# Patient Record
Sex: Female | Born: 1945 | Hispanic: Yes | Marital: Single | Smoking: Never smoker
Health system: Southern US, Community
[De-identification: ages and names within clinical notes are randomized; demographics above are authoritative.]

## PROBLEM LIST (undated history)

## (undated) DIAGNOSIS — I10 Essential (primary) hypertension: Secondary | ICD-10-CM

---

## 2016-05-24 ENCOUNTER — Emergency Department
Admission: EM | Admit: 2016-05-24 | Discharge: 2016-05-28 | Disposition: A | Discharge status: Other Acute Inpatient Hospital | Payer: Medicaid Other | Attending: Emergency Medicine | Admitting: Emergency Medicine

## 2016-05-24 ENCOUNTER — Encounter: Payer: Self-pay | Admitting: Emergency Medicine

## 2016-05-24 DIAGNOSIS — I10 Essential (primary) hypertension: Secondary | ICD-10-CM | POA: Insufficient documentation

## 2016-05-24 DIAGNOSIS — F23 Brief psychotic disorder: Secondary | ICD-10-CM | POA: Insufficient documentation

## 2016-05-24 DIAGNOSIS — R259 Unspecified abnormal involuntary movements: Secondary | ICD-10-CM | POA: Diagnosis not present

## 2016-05-24 DIAGNOSIS — R451 Restlessness and agitation: Secondary | ICD-10-CM | POA: Diagnosis present

## 2016-05-24 DIAGNOSIS — R4182 Altered mental status, unspecified: Secondary | ICD-10-CM

## 2016-05-24 DIAGNOSIS — Z5181 Encounter for therapeutic drug level monitoring: Secondary | ICD-10-CM | POA: Diagnosis not present

## 2016-05-24 HISTORY — DX: Essential (primary) hypertension: I10

## 2016-05-24 LAB — COMPREHENSIVE METABOLIC PANEL
ALT: 17 U/L (ref 14–54)
ANION GAP: 10 (ref 5–15)
AST: 18 U/L (ref 15–41)
Albumin: 4.3 g/dL (ref 3.5–5.0)
Alkaline Phosphatase: 93 U/L (ref 38–126)
BUN: 16 mg/dL (ref 6–20)
CALCIUM: 9.2 mg/dL (ref 8.9–10.3)
CHLORIDE: 102 mmol/L (ref 101–111)
CO2: 28 mmol/L (ref 22–32)
Creatinine, Ser: 0.69 mg/dL (ref 0.44–1.00)
GFR calc non Af Amer: >60 mL/min (ref >60)
Glucose, Bld: 112 mg/dL — ABNORMAL HIGH (ref 65–99)
POTASSIUM: 3.4 mmol/L — AB (ref 3.5–5.1)
SODIUM: 140 mmol/L (ref 135–145)
Total Bilirubin: 0.6 mg/dL (ref 0.3–1.2)
Total Protein: 8.2 g/dL — ABNORMAL HIGH (ref 6.5–8.1)

## 2016-05-24 LAB — CBC WITH DIFFERENTIAL/PLATELET
Basophils Absolute: 0.1 10*3/uL (ref 0–0.1)
Basophils Relative: 1 %
EOS ABS: 0.1 10*3/uL (ref 0–0.7)
Eosinophils Relative: 1 %
HCT: 40.2 % (ref 35.0–47.0)
HEMOGLOBIN: 13.7 g/dL (ref 12.0–16.0)
LYMPHS ABS: 2.6 10*3/uL (ref 1.0–3.6)
LYMPHS PCT: 22 %
MCH: 28.8 pg (ref 26.0–34.0)
MCHC: 34.1 g/dL (ref 32.0–36.0)
MCV: 84.3 fL (ref 80.0–100.0)
Monocytes Absolute: 0.6 10*3/uL (ref 0.2–0.9)
Monocytes Relative: 5 %
NEUTROS PCT: 71 %
Neutro Abs: 8.7 10*3/uL — ABNORMAL HIGH (ref 1.4–6.5)
Platelets: 276 10*3/uL (ref 150–440)
RBC: 4.76 MIL/uL (ref 3.80–5.20)
RDW: 13.8 % (ref 11.5–14.5)
WBC: 12 10*3/uL — AB (ref 3.6–11.0)

## 2016-05-24 LAB — ETHANOL: Alcohol, Ethyl (B): <5 mg/dL (ref <5)

## 2016-05-24 NOTE — ED Notes (Signed)
Pt. Has son-in-law who is the next door neighbor contact [phone]
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
## 2016-05-25 LAB — URINALYSIS COMPLETE WITH MICROSCOPIC (ARMC ONLY)
BILIRUBIN URINE: NEGATIVE
Bacteria, UA: NONE SEEN
GLUCOSE, UA: NEGATIVE mg/dL
Ketones, ur: NEGATIVE mg/dL
LEUKOCYTES UA: NEGATIVE
NITRITE: NEGATIVE
PH: 6 (ref 5.0–8.0)
Protein, ur: NEGATIVE mg/dL
SPECIFIC GRAVITY, URINE: 1.012 (ref 1.005–1.030)

## 2016-05-25 LAB — URINE DRUG SCREEN, QUALITATIVE (ARMC ONLY)
AMPHETAMINES, UR SCREEN: NOT DETECTED
BENZODIAZEPINE, UR SCRN: NOT DETECTED
Barbiturates, Ur Screen: NOT DETECTED
COCAINE METABOLITE, UR ~~LOC~~: NOT DETECTED
Cannabinoid 50 Ng, Ur ~~LOC~~: NOT DETECTED
MDMA (Ecstasy)Ur Screen: NOT DETECTED
METHADONE SCREEN, URINE: NOT DETECTED
OPIATE, UR SCREEN: NOT DETECTED
Phencyclidine (PCP) Ur S: NOT DETECTED
TRICYCLIC, UR SCREEN: NOT DETECTED

## 2016-05-25 MED ORDER — QUETIAPINE FUMARATE 25 MG PO TABS
100.0000 mg | ORAL_TABLET | Freq: Every day | ORAL | Status: DC
  Administered 2016-05-25 – 2016-05-28 (×4): 100 mg via ORAL
  Filled 2016-05-25 (×4): qty 4
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
## 2016-05-25 NOTE — ED Provider Notes (Signed)
-----------------------------------------   2:26 PM on 05/25/2016 -----------------------------------------  I discussed the case with the telepsychiatrist on-call who has evaluated the patient, recommends inpatient admission for mania in the setting of likely bipolar disorder. He recommends 100 mg of Seroquel daily at bedtime which I have ordered as well as prn  Ativan for anxiety.   Gayla DossEryka A Akia Desroches, MD 05/25/16 [phone]

## 2016-05-25 NOTE — ED Notes (Signed)
PT IVC PENDING PSYCH CONSULT. 

## 2016-05-25 NOTE — Progress Notes (Addendum)
Referral information for  Placement have been faxed to;     [facility] ([phone]),    [facility] [phone]/[phone]),    [facility] [phone]),    [facility] ([phone]),    [facility]: Pt denied       05/25/2016 Cheryl FlashNicole Lan Mcneill, MS, NCC, LPCA Therapeutic Triage Specialist
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
## 2016-05-25 NOTE — Progress Notes (Signed)
LCSW completed request for interpreter at 11am through porthole. LCSW will meet with patient and collect information to complete assessment and FL2 and discuss options with patient and family.   Delta Air LinesClaudine Hyland Mollenkopf LCSW [phone]
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
## 2016-05-25 NOTE — Progress Notes (Signed)
[facility]  and [facility].   Rebekah Senatelaudine Mychael Soots LCSW [phone]

## 2016-05-25 NOTE — BH Assessment (Signed)
Assessment Note  Rebekah [name] is an 70 y.o. female presenting to the ED under IVC, initiated by patient's family.  Historical information limited due to patient's AMS and patient's limited English speaking skills.    According to to the IVC, patient is  not eating or drinking at home. Patient denies.  Pt. family reports pt. Gets very argumentative and repeats herself.  Adult protective services is involved.    Diagnosis: Altered Mental Status  Past Medical History:  Past Medical History:  Diagnosis Date  . Hypertension     No past surgical history on file.  Family History: No family history on file.  Social History:  reports that she has never smoked. She does not have any smokeless tobacco history on file. She reports that she does not drink alcohol or use drugs.  Additional Social History:  Alcohol / Drug Use History of alcohol / drug use?: No history of alcohol / drug abuse  CIWA: CIWA-Ar BP: (!) 194/92 Pulse Rate: 65 COWS:    Allergies: No Known Allergies  Home Medications:  (Not in a hospital admission)  OB/GYN Status:  No LMP recorded.  General Assessment Data Location of Assessment: [facility] ED TTS Assessment: In system Is this a Tele or Face-to-Face Assessment?: Face-to-Face Is this an Initial Assessment or a Re-assessment for this encounter?: Initial Assessment Marital status: Single Maiden name: n/a Is patient pregnant?: No Pregnancy Status: No Living Arrangements: Children Can pt return to current living arrangement?: Yes Admission Status: Involuntary Is patient capable of signing voluntary admission?: No Referral Source: Self/Family/Friend Insurance type: None  Medical Screening Exam [facility] Walk-in ONLY) Medical Exam completed: Yes  Crisis Care Plan Living Arrangements: Children Legal Guardian: Other: (Self) Name of Psychiatrist: unknown Name of Therapist: unknown  Education Status Is patient currently in school?: No Current Grade:  n/a Highest grade of school patient has completed: n/a Name of school: n/a Contact person: n/a  Risk to self with the past 6 months Suicidal Ideation: No Has patient been a risk to self within the past 6 months prior to admission? : No Suicidal Intent: No Has patient had any suicidal intent within the past 6 months prior to admission? : No Is patient at risk for suicide?: No Suicidal Plan?: No Has patient had any suicidal plan within the past 6 months prior to admission? : No Access to Means: No What has been your use of drugs/alcohol within the last 12 months?: none reported Previous Attempts/Gestures: No Other Self Harm Risks: Pt's family reports that she is not eating. Triggers for Past Attempts: None known Intentional Self Injurious Behavior: None Family Suicide History: Unknown Recent stressful life event(s): Other (Comment) Persecutory voices/beliefs?: No Depression: No Substance abuse history and/or treatment for substance abuse?: No Suicide prevention information given to non-admitted patients: Not applicable  Risk to Others within the past 6 months Homicidal Ideation: No Does patient have any lifetime risk of violence toward others beyond the six months prior to admission? : No Thoughts of Harm to Others: No Current Homicidal Intent: No Current Homicidal Plan: No Access to Homicidal Means: No Identified Victim: None identified History of harm to others?: No Assessment of Violence: None Noted Violent Behavior Description: None identified Does patient have access to weapons?: No Criminal Charges Pending?: No Does patient have a court date: No Is patient on probation?: No  Psychosis Hallucinations: None noted Delusions: None noted  Mental Status Report Appearance/Hygiene: In scrubs Eye Contact: Good Motor Activity: Freedom of movement Speech: Language other than  English Level of Consciousness: Alert Mood: Apprehensive Affect: Appropriate to  circumstance Anxiety Level: Minimal Thought Processes: Unable to Assess Judgement: Unable to Assess Orientation: Unable to assess Obsessive Compulsive Thoughts/Behaviors: Unable to Assess  Cognitive Functioning Concentration: Unable to Assess Memory: Unable to Assess Insight: Unable to Assess Impulse Control: Unable to Assess Appetite: Fair Sleep: Unable to Assess Vegetative Symptoms: None  ADLScreening [facility]) Patient's cognitive ability adequate to safely complete daily activities?: Yes Patient able to express need for assistance with ADLs?: Yes Independently performs ADLs?: Yes (appropriate for developmental age)  Prior Inpatient Therapy Prior Inpatient Therapy: No Prior Therapy Dates: n/a Prior Therapy Facilty/Provider(s): n/a Reason for Treatment: n/a  Prior Outpatient Therapy Prior Outpatient Therapy: No Prior Therapy Dates: n/a Prior Therapy Facilty/Provider(s): n/a Reason for Treatment: n/a Does patient have an ACCT team?: No Does patient have Intensive In-House Services?  : No Does patient have [facility] services? : No Does patient have P4CC services?: No  ADL Screening (condition at time of admission) Patient's cognitive ability adequate to safely complete daily activities?: Yes Patient able to express need for assistance with ADLs?: Yes Independently performs ADLs?: Yes (appropriate for developmental age)       Abuse/Neglect Assessment (Assessment to be complete while patient is alone) Physical Abuse: Denies Verbal Abuse: Denies Sexual Abuse: Denies Exploitation of patient/patient's resources: Denies Self-Neglect: Denies Values / Beliefs Cultural Requests During Hospitalization: None Spiritual Requests During Hospitalization: None Consults Spiritual Care Consult Needed: No Social Work Consult Needed: Yes (Comment) Advance Directives (For Healthcare) Does patient have an advance directive?: No    Additional Information 1:1 In  Past 12 Months?: No CIRT Risk: No Elopement Risk: No Does patient have medical clearance?: Yes     Disposition:  Disposition Initial Assessment Completed for this Encounter: Yes Disposition of Patient: Other dispositions Other disposition(s): Other (Comment) (Pending Psych MD consult)  On Site Evaluation by:   Reviewed with Physician:    Magdalene Tardiff C Lupie Sawa 05/25/2016 5:11 AM

## 2016-05-25 NOTE — Progress Notes (Signed)
L[facility] and requested additional IVC and clinical notes. They were faxed and we are awaiting nurse to review and possibly accept patient. We are at capacity in the [facility] @[facility]  LCSW awaiting a call back. Delta Air LinesClaudine Chrisann Melaragno  [phone]

## 2016-05-25 NOTE — Progress Notes (Signed)
TTS and LCSW consulted and [facility] say [facility] had [facility] female beds [facility]- psych, Pt information was faxed over and admission nurse will review and call back.    Delta Air LinesClaudine Rakisha Pincock LCSW [phone]

## 2016-05-25 NOTE — Progress Notes (Signed)
LCSW called and left a message for son in law to call me back to assist with some information on patient. Awaiting call back.   Delta Air LinesClaudine Dezmond Downie LCSW [phone]
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
## 2016-05-26 NOTE — ED Notes (Signed)
Pt assessed with spanish interpreter.  Pt. Alert and oriented, warm and dry, in no distress. Pt. Denies SI, HI, and AVH. Patient continues to talk about the [name] and blessing and praying for everyone. Pt. Encouraged to let nursing staff know of any concerns or needs.

## 2016-05-26 NOTE — ED Notes (Signed)
Patient noted in room. No complaints, stable, in no acute distress. Q15 minute rounds and monitoring via Security Cameras to continue.  

## 2016-05-26 NOTE — ED Notes (Signed)
PT IVC/SOC COMPLETE/PT PENDING PLACEMENT/REFERRELS FAXED.

## 2016-05-26 NOTE — ED Notes (Signed)
Patient is in dayroom, no signs of distress, Patient smiling, will continue to monitor, camera monitoring in progress.

## 2016-05-26 NOTE — ED Notes (Signed)
Patient is in room, appears to be meditating, or praying, patient is cooperative and pleasant.

## 2016-05-26 NOTE — ED Notes (Addendum)
Patient sitting in dayroom.  She is asking to go to her previous room in the main ED.

## 2016-05-26 NOTE — ED Notes (Signed)
Patient is spanish speaking and became upset because wanted her bible.  Interpreter services called to translate to the patient that she could not have her bible on the unit because it is hardback.  Chaplain was called, however they do not have any spanish Catholic bibles.  First interpreter was Rebekah [name] [phone] however called disconnected.  Second [facility] [phone]).

## 2016-05-26 NOTE — ED Notes (Signed)
Patient talking on phone to granddaughter. 

## 2016-05-26 NOTE — Progress Notes (Signed)
Pt being reviewed for possible admission to [facility]. H&P and Assessment have been faxed to the [facility] for the charge nurse to review and provide bed assignment.      Nicole Gwynn Crossley, MS, NCC, LPCA Therapeutic Triage Specialist    

## 2016-05-26 NOTE — ED Notes (Addendum)
Son in law called back able to give correct pass word.

## 2016-05-26 NOTE — ED Notes (Signed)
Patient resting sitting up, eyes closed, appears to be praying, q 15 min. Checks and camera surveillance in progress.

## 2016-05-26 NOTE — ED Notes (Signed)
Patient is awake, she is cooperative, no signs of distress, patient with q 15 min. Checks and camera monitoring in progress.

## 2016-05-26 NOTE — ED Notes (Signed)
Patient's son in law called wanting info on PT. He was unable to give pass code. Advised no info could be given without. Rebekah [name] states would call back.

## 2016-05-26 NOTE — ED Notes (Signed)
Patient in dayroom refusing to go to her assigned room.

## 2016-05-26 NOTE — ED Notes (Signed)
Patient came into dayroom, asking for something, made motion of book, nurse obtained her Bible and gave it to her, and patient became extremely happy, patient said " Gracious ' several times. Patient started reading out-loud in dayroom, patient was redirected back to her room due to another Patient in dayroom trying to watch tv. Patient is safe,q 15 minutes checks and camera monitoring in progress.

## 2016-05-26 NOTE — ED Notes (Signed)
Patient taking shower.
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
## 2016-05-26 NOTE — ED Notes (Signed)
Rebekah CoolsDavid [name] [phone] patient son in law

## 2016-05-27 ENCOUNTER — Emergency Department: Payer: Medicaid Other

## 2016-05-27 DIAGNOSIS — F3112 Bipolar disorder, current episode manic without psychotic features, moderate: Secondary | ICD-10-CM

## 2016-05-27 NOTE — ED Notes (Signed)
This Clinical research associatewriter and interpreter went in with patient to do assessment. Pt. Alert and oriented, warm and dry, in no distress. Pt. Denies SI, HI, and AVH. Pt. Encouraged to let nursing staff know of any concerns or needs.
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
## 2016-05-27 NOTE — ED Notes (Signed)

## 2016-05-27 NOTE — ED Notes (Signed)

## 2016-05-27 NOTE — ED Provider Notes (Signed)
-----------------------------------------   7:02 AM on 05/27/2016 -----------------------------------------   Blood pressure 118/71, pulse 68, temperature 97.5 F (36.4 C), temperature source Oral, resp. rate 18, height 5' (1.524 m), weight 120 lb (54.4 kg), SpO2 100 %.  The patient had no acute events since last update.  Calm and cooperative at this time.  Disposition is pending Psychiatry/Behavioral Medicine team recommendations.     Sharman CheekPhillip Emilina Smarr, MD 05/27/16 [phone]

## 2016-05-27 NOTE — Progress Notes (Signed)
Per Dr. Toni Amendlapacs [facility] pt. [facility], no medical problems. Rebekah [name], LCAS-A, LPC-A, [facility]  Counselor 05/27/2016 4:10 PM

## 2016-05-27 NOTE — ED Notes (Addendum)
Rebekah [name] [phone]), pt's granddaughter, called (had code) and said that the pt's daughter would like the son-in-law to NOT be the primary family contact. Pt's daughter, Rebekah [name], would like to be the primary contact through an interpreter (or her granddaughter -- Rebekah GoldOlga Common does not speak English -- if an interpreter is not available).   Rebekah [name] says that Rebekah [name] does not like the son-in-law. Rebekah [name] says the son-in-law wants the pt to go to a care facility and get her "out of the way."

## 2016-05-27 NOTE — ED Notes (Signed)
Pt has been calm and cooperative this shift. She has been up to the bathroom, present in the dayroom, and ambulating without difficulty. She smiles and is pleasant. Her family has called multiple times to inquire about her condition and disposition/placement. Family members have had the code, so this Clinical research associatewriter and other staff have given them updates.

## 2016-05-27 NOTE — Consult Note (Signed)
[facility] Face-to-Face Psychiatry Consult   Reason for Consult:  Consult for this 70 year old woman brought in on commitment by Adult Protective Services because of bizarre behavior. Referring Physician:  Inocencio Homes Patient Identification: Rebekah [name] MRN:  [id number] Principal Diagnosis: Moderate bipolar I disorder with mania as current episode [facility]) Diagnosis:   Patient Active Problem List   Diagnosis Date Noted  . Moderate bipolar I disorder with mania as current episode (HCC) [F31.12] 05/27/2016    Total Time spent with patient: 1 hour  Subjective:   Rebekah [name] is a 70 y.o. female patient admitted with "this is all because of my daughter".  HPI:  Patient interviewed with the assistance of a hospital Spanish language interpreter. Secondhand information from the patient's daughter who called the emergency room. Review of labs and vitals and early assessments. 70 year old woman was brought in by Adult Management consultant. They have been called because of concern about the patient's health and well-being. Reports are that the patient has not been eating or drinking for some time although clearly it hasn't been a terribly long time and she doesn't appear to be in particularly ill health. Irving Burton had reported that she has strange behavior. The commitment paperwork from Adult Protective Services describes the patient standing out in the middle of the street, jumping on top of a car and acting bizarre in several ways. On interview today with Spanish language interpreter the patient had a very disorganized and hyperreligious thought process. She was unable to answer most questions directly but instead would go off on tangents telling stories about unrelated people she has known before, things that it happened sometime in the past in the [place], complaints about money etc. Most of it ended up being hyper religious [place] more so as the conversation went along. Patient denies that  she drinks or uses any drugs. Denies having any medical problems that she is aware of. The patient was evasive about how much she sleeps but claims that she sleeps fine. She adamantly declined to have any symptoms.  Social history: Patient has been living here in [place] for about 4 years. Closest relative here locally as her daughter who apparently also only speaks [place]. Reportedly the patient lives nearby to the daughter although I never could clarify the living situation.  Medical history: Patient denies having any kind of medical problems whatsoever. The closest she could come to a was saying that she had an injury to her ankle many years ago.  Substance abuse history: Denies any use of alcohol or drugs currently or in the past.  Past Psychiatric History: Patient denies ever having seen a mental health provider or psychiatrist in the past. I'm not sure if I believe 100% of this because her history is so disorganized. Daughter also says that she is not aware of any mental health history but it sounds like there are some big gaps in what she knows about her mother.  Risk to Self: Suicidal Ideation: No Suicidal Intent: No Is patient at risk for suicide?: No Suicidal Plan?: No Access to Means: No What has been your use of drugs/alcohol within the last 12 months?: none reported Other Self Harm Risks: Pt's family reports that she is not eating. Triggers for Past Attempts: None known Intentional Self Injurious Behavior: None Risk to Others: Homicidal Ideation: No Thoughts of Harm to Others: No Current Homicidal Intent: No Current Homicidal Plan: No Access to Homicidal Means: No Identified Victim: None identified History of harm to others?: No  Assessment of Violence: None Noted Violent Behavior Description: None identified Does patient have access to weapons?: No Criminal Charges Pending?: No Does patient have a court date: No Prior Inpatient Therapy: Prior Inpatient Therapy:  No Prior Therapy Dates: n/a Prior Therapy Facilty/Provider(s): n/a Reason for Treatment: n/a Prior Outpatient Therapy: Prior Outpatient Therapy: No Prior Therapy Dates: n/a Prior Therapy Facilty/Provider(s): n/a Reason for Treatment: n/a Does patient have an ACCT team?: No Does patient have Intensive In-House Services?  : No Does patient have [facility] services? : No Does patient have P4CC services?: No  Past Medical History:  Past Medical History:  Diagnosis Date  . Hypertension    No past surgical history on file. Family History: No family history on file. Family Psychiatric  History: Patient denies any family history of mental illness Social History:  History  Alcohol Use No     History  Drug Use No    Social History   Social History  . Marital status: Single    Spouse name: N/A  . Number of children: N/A  . Years of education: N/A   Social History Main Topics  . Smoking status: Never Smoker  . Smokeless tobacco: None  . Alcohol use No  . Drug use: No  . Sexual activity: Not Asked   Other Topics Concern  . None   Social History Narrative  . None   Additional Social History:    Allergies:  No Known Allergies  Labs: No results found for this or any previous visit (from the past 48 hour(s)).  Current Facility-Administered Medications  Medication Dose Route Frequency Provider Last Rate Last Dose  . QUEtiapine (SEROQUEL) tablet 100 mg  100 mg Oral QHS Gayla Doss, MD   100 mg at 05/26/16 2133   No current outpatient prescriptions on file.    Musculoskeletal: Strength & Muscle Tone: within normal limits Gait & Station: normal Patient leans: N/A  Psychiatric Specialty Exam: Physical Exam  Nursing note and vitals reviewed. Constitutional: She appears well-developed and well-nourished.  HENT:  Head: Normocephalic and atraumatic.  Eyes: Conjunctivae are normal. Pupils are equal, round, and reactive to light.  Neck: Normal range of motion.   Cardiovascular: Normal heart sounds.   Respiratory: Effort normal.  GI: Soft.  Musculoskeletal: Normal range of motion.  Neurological: She is alert.  Skin: Skin is warm and dry.  Psychiatric: Her affect is labile and inappropriate. Her speech is rapid and/or pressured and tangential. Thought content is paranoid. She expresses impulsivity. She expresses no homicidal and no suicidal ideation. She exhibits abnormal recent memory. She is inattentive.    Review of Systems  Constitutional: Negative.   HENT: Negative.   Eyes: Negative.   Respiratory: Negative.   Cardiovascular: Negative.   Gastrointestinal: Negative.   Musculoskeletal: Negative.   Skin: Negative.   Neurological: Negative.   Psychiatric/Behavioral: Negative for depression, hallucinations, memory loss, substance abuse and suicidal ideas. The patient is not nervous/anxious and does not have insomnia.     Blood pressure 118/71, pulse 68, temperature 97.5 F (36.4 C), temperature source Oral, resp. rate 18, height 5' (1.524 m), weight 54.4 kg (120 lb), SpO2 100 %.Body mass index is 23.44 kg/m.  General Appearance: Fairly Groomed  Eye Contact:  Fair  Speech:  Pressured  Volume:  Increased  Mood:  Euphoric  Affect:  Labile  Thought Process:  Disorganized and Irrelevant  Orientation:  Full (Time, Place, and Person)  Thought Content:  Illogical, Paranoid Ideation and Tangential  Suicidal Thoughts:  No  Homicidal Thoughts:  No  Memory:  Immediate;   Good Recent;   Fair Remote;   Fair  Judgement:  Impaired  Insight:  Lacking  Psychomotor Activity:  Increased  Concentration:  Concentration: Fair  Recall:  FiservFair  Fund of Knowledge:  Fair  Language:  Fair  Akathisia:  No  Handed:  Right  AIMS (if indicated):     Assets:  Manufacturing systems engineerCommunication Skills Housing Physical Health Resilience Social Support  ADL's:  Intact  Cognition:  WNL  Sleep:        Treatment Plan Summary: Daily contact with patient to assess and evaluate  symptoms and progress in treatment, Medication management and Plan This is a 70 year old woman who has had bizarre behavior some of which sounds like it has been chronic although it sounds like things of gotten worse recently. To my interview she strikes me as being very likely to be manic. It was hard to get her to cooperate with most of the mental status exam because it was hard to get her slowed down from her hyper religious talk but she did not appear to be demented as near as I can tell. I am putting in a diagnosis of bipolar mania which is tentative of course. Patient had been started on Seroquel by the tele-psychiatry doctor. Patient otherwise appears to be in good health and to be fully ambulatory. I don't see any reason for her to be referred to an outside hospital when we have the ability to take care of her here and even have a psychiatrist who is fluent in [place]. Patient will be admitted to the psychiatry ward for further evaluation and treatment.  Disposition: Recommend psychiatric Inpatient admission when medically cleared. Supportive therapy provided about ongoing stressors.  Mordecai RasmussenJohn Criag Wicklund, MD 05/27/2016 4:07 PM

## 2016-05-27 NOTE — Progress Notes (Signed)
05/27/16: [facility] Allayah Raineri K. Sherlon HandingHarris, LCAS-A, LPC-A, [facility]  Counselor 05/27/2016 3:35 PM

## 2016-05-28 ENCOUNTER — Inpatient Hospital Stay
Admission: RE | Admit: 2016-05-28 | Discharge: 2016-06-03 | DRG: 885 | Disposition: A | Discharge status: Home / Self Care | Payer: No Typology Code available for payment source | Source: Intra-hospital | Attending: Psychiatry | Admitting: Psychiatry

## 2016-05-28 DIAGNOSIS — F319 Bipolar disorder, unspecified: Secondary | ICD-10-CM | POA: Diagnosis present

## 2016-05-28 DIAGNOSIS — F23 Brief psychotic disorder: Secondary | ICD-10-CM | POA: Diagnosis present

## 2016-05-28 DIAGNOSIS — I10 Essential (primary) hypertension: Secondary | ICD-10-CM | POA: Diagnosis present

## 2016-05-28 DIAGNOSIS — F3110 Bipolar disorder, current episode manic without psychotic features, unspecified: Secondary | ICD-10-CM | POA: Diagnosis not present

## 2016-05-28 DIAGNOSIS — G4700 Insomnia, unspecified: Secondary | ICD-10-CM | POA: Diagnosis present

## 2016-05-28 MED ORDER — QUETIAPINE FUMARATE 100 MG PO TABS: 100.0000 mg | ORAL_TABLET | Freq: Every day | ORAL | Status: DC

## 2016-05-28 MED ORDER — ACETAMINOPHEN 325 MG PO TABS: 650.0000 mg | ORAL_TABLET | Freq: Four times a day (QID) | ORAL | Status: DC | PRN

## 2016-05-28 MED ORDER — MAGNESIUM HYDROXIDE 400 MG/5ML PO SUSP: 30.0000 mL | Freq: Every day | ORAL | Status: DC | PRN

## 2016-05-28 MED ORDER — ALUM & MAG HYDROXIDE-SIMETH 200-200-20 MG/5ML PO SUSP: 30.0000 mL | ORAL | Status: DC | PRN

## 2016-05-28 NOTE — ED Notes (Signed)
Patient currently in shower. No signs of acute distress noted. Maintained on 15 minute checks and observation by security camera for safety.

## 2016-05-28 NOTE — ED Notes (Signed)
Patient asleep in room. No noted distress or abnormal behavior. Will continue 15 minute checks and observation by security cameras for safety. 

## 2016-05-28 NOTE — Progress Notes (Signed)
CSW received a call from pt's daughter, Rebekah [name], at [phone]. CSW called interpreter services at [phone] and merged the phone calls so that the interpreter, CSW, and Rebekah [name] were on a conference call together. Rebekah [name] states that she is in fact willing to pick pt up upon d/c. CSW informed her that pt continues to wait on inpt placement at this time, but upon d/c pt will be released into her daughter's care. Rebekah [name] is agreeable with this plan.  Jonathon JordanLynn B Kynlie Jane, MSW, Theresia MajorsLCSWA [phone]

## 2016-05-28 NOTE — ED Provider Notes (Addendum)
-----------------------------------------   7:31 AM on 05/28/2016 -----------------------------------------   Blood pressure 111/76, pulse 74, temperature 97.6 F (36.4 C), temperature source Oral, resp. rate 16, height 5' (1.524 m), weight 120 lb (54.4 kg), SpO2 97 %.  The patient had no acute events since last update.  Calm and cooperative at this time.   The [facility]. [facility] at [facility]   She is awaiting acceptance to a facility.     Rebecka ApleyAllison P Webster, MD 05/28/16 0732    Rebecka ApleyAllison P Webster, MD 05/28/16 [phone]

## 2016-05-28 NOTE — ED Notes (Signed)
Patient resting quietly in room. No noted distress or abnormal behaviors noted. Will continue 15 minute checks and observation by security camera for safety. 

## 2016-05-28 NOTE — BH Assessment (Signed)
[facility]. Toni Amendlapacs.  Attending Physician will be Dr. Ardyth HarpsHernandez.   Patient has been [facility] to room 319, [facility] Charge Nurse [place]ound[place]wen F.   Intake Paper Work has been signed and placed on patient chart.  ER staff is aware of the admission Rebekah [name](Lisa, ER Sect.; Dr. Darnelle CatalanMalinda, ER MD; Kasandra KnudsenKarena, Patient's Nurse & Angelique Blonderenise, Patient Access).

## 2016-05-28 NOTE — Progress Notes (Addendum)
Spoke with Darreld Mcleanrina, RN regarding EKG scheduled for pt. Pt needs an interpreter so now is not a good time. Ask for EKG to be performed in the AM. EKG will be attempted in AM.

## 2016-05-28 NOTE — ED Notes (Signed)
Patient blood pressure is currently 168/97. Physician notified, awaiting further orders at this time.

## 2016-05-28 NOTE — ED Notes (Signed)
ENVIRONMENTAL ASSESSMENT Potentially harmful objects out of patient reach: Yes Personal belongings secured: Yes Patient dressed in hospital provided attire only: Yes Plastic bags out of patient reach: Yes Patient care equipment (cords, cables, call bells, lines, and drains) shortened, removed, or accounted for: Yes Equipment and supplies removed from bottom of stretcher: Yes Potentially toxic materials out of patient reach: Yes Sharps container removed or out of patient reach: Yes  Patient currently in room sleeping. No signs of distress noted. Maintained on 15 minute checks and observation by security camera for safety.  
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
## 2016-05-28 NOTE — Progress Notes (Signed)
CSW called Max Fickleina Reese APS Worker [phone]. CSW informed Inetta Fermoina that pt is still awaiting psych inpt placement. Inetta Fermoina states that to her understanding pt's family is unwilling to take pt home upon d/c. This presents a problem because pt does not have insurance so a group home placement could not be an option. CSW stated that she would check with the family and then call Inetta Fermoina back.  CSW consulted with RN (who speaks fluent Spanish) and she spoke with pt's daughter, [name]. [name] states that she is wiling to have pt come live with her upon d/c.   CSW called an interpreter to come and speak with the family just to confirm the discharge plan and give the family a update on pt's status. CSW called Onalee HuaDavid (son-in-law) [phone] and [name] (pt's daughter) [phone]. Both Onalee Huaavid and Marian Sorrowlga did not answer. CSW left a message. Awaiting call back and will page interpreter if call is returned.   CSW called Max Fickleina Reese [phone] to update her on pt's discharge plan. No answer, left a message. Awaiting call back.  Jonathon JordanLynn B Aailyah Dunbar, MSW, Theresia MajorsLCSWA [phone]

## 2016-05-28 NOTE — ED Notes (Signed)
Patient DSS worker Max Fickleina Reese [phone]) called asking for an update on the patient's status and requested to speak with social work. This Clinical research associatewriter relayed message to social work and informed Inetta Fermoina patient disposition is still undetermined.

## 2016-05-28 NOTE — ED Notes (Signed)

## 2016-05-28 NOTE — ED Notes (Signed)
Patient assessed with interpreter at bedside.  Pt. Alert and oriented, warm and dry, in no distress. Pt. Denies SI, HI, and AVH. Pt was made aware of future plans to be admitted to BMU. Patient in agreement. Pt. Encouraged to let nursing staff know of any concerns or needs.

## 2016-05-28 NOTE — ED Notes (Signed)

## 2016-05-28 NOTE — ED Notes (Signed)
Patient denies SI/HI/AVH and pain. Patient continues to have religious thought content but is cooperative with nursing interventions and calm.  Will continue to monitor. Maintained on 15 minute checks and observation by security camera for safety.

## 2016-05-28 NOTE — ED Notes (Signed)
[facility].

## 2016-05-29 ENCOUNTER — Other Ambulatory Visit: Payer: Self-pay

## 2016-05-29 DIAGNOSIS — F3110 Bipolar disorder, current episode manic without psychotic features, unspecified: Secondary | ICD-10-CM

## 2016-05-29 LAB — LIPID PANEL
CHOL/HDL RATIO: 4.9 ratio
CHOLESTEROL: 172 mg/dL (ref 0–200)
HDL: 35 mg/dL — AB (ref >40)
LDL Cholesterol: UNDETERMINED mg/dL (ref 0–99)
TRIGLYCERIDES: 505 mg/dL — AB (ref <150)
VLDL: UNDETERMINED mg/dL (ref 0–40)

## 2016-05-29 LAB — TSH: TSH: 1.227 u[IU]/mL (ref 0.350–4.500)

## 2016-05-29 MED ORDER — RISPERIDONE 1 MG PO TABS
1.0000 mg | ORAL_TABLET | Freq: Two times a day (BID) | ORAL | Status: DC
  Administered 2016-05-29 – 2016-06-03 (×10): 1 mg via ORAL
  Filled 2016-05-29 (×10): qty 1

## 2016-05-29 MED ORDER — AMLODIPINE BESYLATE 5 MG PO TABS
2.5000 mg | ORAL_TABLET | Freq: Every day | ORAL | Status: DC
  Administered 2016-05-29: 2.5 mg via ORAL
  Filled 2016-05-29: qty 1

## 2016-05-29 MED ORDER — AMLODIPINE BESYLATE 5 MG PO TABS
5.0000 mg | ORAL_TABLET | Freq: Every day | ORAL | Status: DC
  Administered 2016-05-30 – 2016-06-03 (×5): 5 mg via ORAL
  Filled 2016-05-29 (×5): qty 1

## 2016-05-29 MED ORDER — AMLODIPINE BESYLATE 5 MG PO TABS: 5.0000 mg | ORAL_TABLET | Freq: Every day | ORAL | Status: DC

## 2016-05-29 MED ORDER — CLONIDINE HCL 0.1 MG PO TABS
0.1000 mg | ORAL_TABLET | Freq: Once | ORAL | Status: AC
  Administered 2016-05-29: 0.1 mg via ORAL
  Filled 2016-05-29: qty 1

## 2016-05-29 NOTE — [facility] Group Notes (Signed)
[facility] LCSW Group Therapy   05/29/2016  1:00 pm   Type of Therapy: Group Therapy   Participation Level: Invited but did not attend.  Participation Quality: Invited but did not attend.     Hampton AbbotKadijah Shawana Knoch, MSW, LCSWA 05/29/2016, 2:24PM

## 2016-05-29 NOTE — Tx Team (Signed)
Interdisciplinary Treatment and Diagnostic Plan Update  05/29/2016 Time of Session: 11:24 AM  Shia Delaine MRN: [id number]  Principal Diagnosis: Bipolar affective, manic (HCC)  Secondary Diagnoses: Principal Problem:   Bipolar affective, manic (HCC)   Current Medications:  Current Facility-Administered Medications  Medication Dose Route Frequency Provider Last Rate Last Dose  . acetaminophen (TYLENOL) tablet 650 mg  650 mg Oral Q6H PRN Audery Amel, MD      . alum & mag hydroxide-simeth (MAALOX/MYLANTA) 200-200-20 MG/5ML suspension 30 mL  30 mL Oral Q4H PRN Audery Amel, MD      . magnesium hydroxide (MILK OF MAGNESIA) suspension 30 mL  30 mL Oral Daily PRN Audery Amel, MD      . QUEtiapine (SEROQUEL) tablet 100 mg  100 mg Oral QHS Audery Amel, MD        PTA Medications: No prescriptions prior to admission.    Treatment Modalities: Medication Management, Group therapy, Case management,  1 to 1 session with clinician, Psychoeducation, Recreational therapy.   Physician Treatment Plan for Primary Diagnosis: Bipolar affective, manic (HCC) Long Term Goal(s): Improvement in symptoms so as ready for discharge  Short Term Goals: Ability to identify changes in lifestyle to reduce recurrence of condition will improve, Ability to verbalize feelings will improve, Compliance with prescribed medications will improve and Ability to identify triggers associated with substance abuse/mental health issues will improve  Medication Management: Evaluate patient's response, side effects, and tolerance of medication regimen.  Therapeutic Interventions: 1 to 1 sessions, Unit Group sessions and Medication administration.  Evaluation of Outcomes: Progressing  Physician Treatment Plan for Secondary Diagnosis: Principal Problem:   Bipolar affective, manic (HCC)   Long Term Goal(s): Improvement in symptoms so as ready for discharge  Short Term Goals: Ability to identify changes in  lifestyle to reduce recurrence of condition will improve, Ability to verbalize feelings will improve, Ability to demonstrate self-control will improve, Compliance with prescribed medications will improve and Ability to identify triggers associated with substance abuse/mental health issues will improve  Medication Management: Evaluate patient's response, side effects, and tolerance of medication regimen.  Therapeutic Interventions: 1 to 1 sessions, Unit Group sessions and Medication administration.  Evaluation of Outcomes: Progressing   RN Treatment Plan for Primary Diagnosis: Bipolar affective, manic (HCC) Long Term Goal(s): Knowledge of disease and therapeutic regimen to maintain health will improve  Short Term Goals: Ability to remain free from injury will improve, Ability to identify and develop effective coping behaviors will improve and Compliance with prescribed medications will improve  Medication Management: RN will administer medications as ordered by provider, will assess and evaluate patient's response and provide education to patient for prescribed medication. RN will report any adverse and/or side effects to prescribing provider.  Therapeutic Interventions: 1 on 1 counseling sessions, Psychoeducation, Medication administration, Evaluate responses to treatment, Monitor vital signs and CBGs as ordered, Perform/monitor CIWA, COWS, AIMS and Fall Risk screenings as ordered, Perform wound care treatments as ordered.  Evaluation of Outcomes: Progressing   LCSW Treatment Plan for Primary Diagnosis: Bipolar affective, manic (HCC) Long Term Goal(s): Safe transition to appropriate next level of care at discharge, Engage patient in therapeutic group addressing interpersonal concerns.  Short Term Goals: Engage patient in aftercare planning with referrals and resources, Increase social support, Increase ability to appropriately verbalize feelings, Increase emotional regulation, Facilitate  acceptance of mental health diagnosis and concerns and Increase skills for wellness and recovery  Therapeutic Interventions: Assess for all discharge needs, 1 to 1  time with Child psychotherapistocial worker, Explore available resources and support systems, Assess for adequacy in community support network, Educate family and significant other(s) on suicide prevention, Complete Psychosocial Assessment, Interpersonal group therapy.  Evaluation of Outcomes: Progressing   Progress in Treatment: Attending groups: CSW still assessing, pt new to milieu Participating in groups: CSW still assessing, pt new to milieu Taking medication as prescribed: Yes, MD continues to assess for medication changes as needed Toleration medication: Yes, no side effects reported at this time Family/Significant other contact made: CSW, still assessing for appropriate contacts Patient understands diagnosis: Yes Discussing patient identified problems/goals with staff: Yes Medical problems stabilized or resolved:  Yes   Denies suicidal/homicidal ideation: Yes Issues/concerns per patient self-inventory: None Other: N/A  New problem(s) identified: None identified at this time.   New Short Term/Long Term Goal(s): None identified at this time.   Discharge Plan or Barriers: [facility] and will follow up with ___ for medication management and therapy   Reason for Continuation of Hospitalization: Anxiety Depression Hallucinations Delusions  Mania  Estimated Length of Stay/Date of discharge: 3-5 days  Attendees:  Patient:  05/29/2016  10:44 AM  Physician: Dr. Ardyth HarpsHernandez, MD 05/29/2016  10:44 AM  Nursing: Hulan AmatoGwen Farrish, RN 05/29/2016  10:44 AM  Social Worker: Dorothe PeaJonathan F. Durward ParcelRiffey, LCSWA, LCAS 05/29/2016  10:44 AM  Social Worker: Hampton AbbotKadijah Grant LCSWA 05/29/2016  10:44 AM  Recreational Therapist: Hershal CoriaBeth Greene, LRT 05/29/2016  10:44 AM   05/29/2016  10:44 AM   05/29/2016  10:44 AM  Other: 05/29/2016  10:44 AM     Scribe for Treatment Team:  Dorothe PeaJonathan F. Montae Stager MSW, LCSWA, LCAS
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
## 2016-05-29 NOTE — Progress Notes (Signed)
Assessment completed with Hospital Provided Spanish Interpreter, Mr [place]. Ms Chinita Greenlandlbertina Overby was BIB Adult Protective Services on IVC; patient standing in the middle of the street, jumping on top of a car and acting bizarre in several ways. Patient was argumentative with the interpreter, refused to sign "Treatment Agreement & other pertinent forms", denied any odd or bizarre behaviors. She claimed that her daughter threw her out because of constant argument between them. In her possession, patient has a [place] Bible, which she placed on a pillow neatly beside her in the room after the assessment was completed

## 2016-05-29 NOTE — Progress Notes (Signed)
Patient with sad affect, anxious and perseverating over her keys and papers. Patient is worried about her Bible and Bible covers. Patient does have a Bible in her room and is reading this morning. Requests to make long distance phone call to her aunt and gives staff a phone number with 11 digits. Interpretor present for assessment and evaluation and EKG and meeting with social worker this morning. MD into visit patient. No SI/HI/AVH at this time. Patient with unsteady gait and reports she is tired and weak. VS monitored and recorded. Med adjustments in place. Patient rests in bed this afternoon. Good appetite for evening meal. Tolerates po fluids well. Wheelchair and supervision and assist in place for transfers. Patient speaks with Migdalia DkLeidy Toberas at [phone] this evening. Toileted with supervision of 1. Safety maintained.

## 2016-05-29 NOTE — Tx Team (Signed)
Initial Treatment Plan 05/29/2016 2:46 AM Rebekah KeelsAlbertina Myrtis HoppingMarie [name] ZOX:[id number]    PATIENT STRESSORS: Educational concerns Marital or family conflict   PATIENT STRENGTHS: Religious Affiliation   PATIENT IDENTIFIED PROBLEMS: Psychosis  Mood Instability                   DISCHARGE CRITERIA:  Improved stabilization in mood, thinking, and/or behavior Motivation to continue treatment in a less acute level of care Reduction of life-threatening or endangering symptoms to within safe limits  PRELIMINARY DISCHARGE PLAN: Return to previous living arrangement  PATIENT/FAMILY INVOLVEMENT: This treatment plan has been presented to and reviewed with the patient, Rebekah [name].  The patient have been given the opportunity to ask questions and make suggestions.  Cleotis NipperAbiodun T Tresten Pantoja, RN 05/29/2016, 2:46 AM

## 2016-05-29 NOTE — BHH Group Notes (Signed)
BHH Group Notes:  (Nursing/MHT/Case Management/Adjunct)  Date:  05/29/2016  Time:  9:25 PM  Type of Therapy:  Evening Wrap-up Group  Participation Level:  Did Not Attend  Participation Quality:  N/A  Affect:  N/A  Cognitive:  N/A  Insight:  None  Engagement in Group:  N/A  Modes of Intervention:  Activity  Summary of Progress/Problems:  Tomasita MorrowChelsea Nanta Jashawna Reever 05/29/2016, 9:25 PM

## 2016-05-29 NOTE — Tx Team (Signed)
Initial Treatment Plan 05/29/2016 1:12 PM Rebekah Sourslbertina Marie Leib [id number]    PATIENT STRESSORS: Financial difficulties Health problems Marital or family conflict   PATIENT STRENGTHS: Capable of independent living Physical Health   PATIENT IDENTIFIED PROBLEMS: Bipolar    Mania                 DISCHARGE CRITERIA:  Adequate post-discharge living arrangements Improved stabilization in mood, thinking, and/or behavior Motivation to continue treatment in a less acute level of care  PRELIMINARY DISCHARGE PLAN: Attend aftercare/continuing care group Return to previous living arrangement  PATIENT/FAMILY INVOLVEMENT: This treatment plan has been presented to and reviewed with the patient, Rebekah [name], and/or family member, .  The patient and family have been given the opportunity to ask questions and make suggestions.  Ignacia FellingJennifer A Roemello Speyer, RN 05/29/2016, 1:12 PM
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
## 2016-05-29 NOTE — BHH Group Notes (Signed)
B[facility] Group Notes:  (Nursing/MHT/Case Management/Adjunct)  Date:  05/29/2016  Time:  6:02 PM  Type of Therapy:  Psychoeducational Skills  Participation Level:  Did Not Attend   Lynelle SmokeCara Travis [facility] HospitalMadoni 05/29/2016, 6:02 PM

## 2016-05-29 NOTE — Progress Notes (Signed)
Skin search done with Vanice SarahAbi RN and Darreld Mcleanrina RN. No skin abnormalities or contraband found.

## 2016-05-29 NOTE — Progress Notes (Signed)
Called and spoke with Shaune Pollacklubukola, RN about EKG ordered for patient. Pt is not currently having chest pain but Olubukola would still like for the EKG to be performed. Verified that patient is in room. Notified RN that I was on the way to perform EKG. When I arrived the patient had gone to take a shower and was already covered in soap. Shaune PollackOlubukola states that she will call when patient is available for EKG.

## 2016-05-29 NOTE — B[facility] Suicide Risk Assessment (Signed)
[facility]   Nursing information obtained from:    Demographic factors:    Current Mental Status:    Loss Factors:    Historical Factors:    Risk Reduction Factors:     Total Time spent with patient: 1 hour Principal Problem: Bipolar affective, manic (HCC) Diagnosis:   Patient Active Problem List   Diagnosis Date Noted  . Bipolar affective, manic (HCC) [F31.10] 05/28/2016   Subjective Data:   Continued Clinical Symptoms:  Alcohol Use Disorder Identification Test Final Score (AUDIT): 0 The "Alcohol Use Disorders Identification Test", Guidelines for Use in Primary Care, Second Edition.  World Science writerHealth Organization [facility](WHO). Score between 0-7:  no or low risk or alcohol related problems. Score between 8-15:  moderate risk of alcohol related problems. Score between 16-19:  high risk of alcohol related problems. Score 20 or above:  warrants further diagnostic evaluation for alcohol dependence and treatment.   CLINICAL FACTORS:   Severe Anxiety and/or Agitation Currently Psychotic   Psychiatric Specialty Exam: Physical Exam  ROS  Blood pressure (!) 143/87, pulse 74, temperature 97.7 F (36.5 C), temperature source Oral, resp. rate 18, height 5' (1.524 m), weight 58.1 kg (128 lb), SpO2 98 %.Body mass index is 25 kg/m.                                                    Sleep:  Number of Hours: 5.15      COGNITIVE FEATURES THAT CONTRIBUTE TO RISK:  Loss of executive function    SUICIDE RISK:   Moderate:  Frequent suicidal ideation with limited intensity, and duration, some specificity in terms of plans, no associated intent, good self-control, limited dysphoria/symptomatology, some risk factors present, and identifiable protective factors, including available and accessible social support.   [facility]  I certify that inpatient services furnished can reasonably be expected to improve the patient's condition.   Jimmy FootmanHernandez-Gonzalez,  Aamari Strawderman, MD 05/29/2016, 10:26 AM

## 2016-05-30 LAB — PROLACTIN: Prolactin: 17.5 ng/mL (ref 4.8–23.3)

## 2016-05-30 LAB — RAPID HIV SCREEN (HIV 1/2 AB+AG)
HIV 1/2 ANTIBODIES: NONREACTIVE
HIV-1 P24 Antigen - HIV24: NONREACTIVE

## 2016-05-30 LAB — VITAMIN B12: VITAMIN B 12: 402 pg/mL (ref 180–914)

## 2016-05-30 LAB — AMMONIA: Ammonia: 14 umol/L (ref 9–35)

## 2016-05-30 LAB — HEMOGLOBIN A1C
Hgb A1c MFr Bld: 6.1 % — ABNORMAL HIGH (ref 4.8–5.6)
Mean Plasma Glucose: 128 mg/dL

## 2016-05-30 MED ORDER — LORAZEPAM 0.5 MG PO TABS
0.5000 mg | ORAL_TABLET | Freq: Every day | ORAL | Status: DC
  Administered 2016-05-30 – 2016-06-02 (×4): 0.5 mg via ORAL
  Filled 2016-05-30 (×4): qty 1

## 2016-05-30 NOTE — Plan of Care (Signed)
Problem: Coping: Goal: Ability to verbalize feelings will improve Outcome: Progressing Patient verbalized feelings.    
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
## 2016-05-30 NOTE — Progress Notes (Signed)
Patient's blood pressure checked, appears much better patient expressed less chest pain/ discomfort via interpreter.

## 2016-05-30 NOTE — [facility] Group Notes (Signed)
[facility] LCSW Group Therapy   05/30/2016 9:30 am   Type of Therapy: Group Therapy   Participation Level: Invited but did not attend.  Participation Quality:  Invited but did not attend.   Hampton AbbotKadijah Lorianna Spadaccini, MSW, LCSW-A 05/30/2016, 11:23AM

## 2016-05-30 NOTE — [facility] Suicide Risk Assessment (Signed)
[facility] INPATIENT:  Family/Significant Other Suicide Prevention Education  Suicide Prevention Education:  Contact Attempts: daughter, Shanon RosserOlga ph #: [phone] has been identified by the patient as the family member/significant other with whom the patient will be residing, and identified as the person(s) who will aid the patient in the event of a mental health crisis.  With written consent from the patient, two attempts were made to provide suicide prevention education, prior to and/or following the patient's discharge.  We were unsuccessful in providing suicide prevention education.  A suicide education pamphlet was given to the patient to share with family/significant other.  Date and time of first attempt: 05/30/2016/ 12:10PM Date and time of second attempt: 05/30/2016/ 3:45PM  Lynden OxfordKadijah R Ireoluwa Gorsline, MSW, LCSW-A 05/30/2016, 3:43 PM

## 2016-05-30 NOTE — Progress Notes (Signed)
Respiratory therapist was here to do EKG patient already in the shower taking a bath, advised respiratory that writer will call when she is ready for EKG.

## 2016-05-30 NOTE — Progress Notes (Signed)
Patient alert and oriented x 2-3, patient does not speak Englisch via interpreter was able to communicate with interpreter. Patient denies SI/HI/AVH, Patient c/o an abrupt chest pain/ discomfort while in the dayroom. Patient's vital sign  Was checked blood pressure was noted to be elevated; MD on call notified and an EKG was ordered in addition clonidine .1mg  was ordered, will continue to reassess and monitor closely.

## 2016-05-30 NOTE — [facility] Group Notes (Signed)
[facility] Group Notes:  (Nursing/MHT/Case Management/Adjunct)  Date:  05/30/2016  Time:  4:34 PM  Type of Therapy:  Psychoeducational Skills  Participation Level:  Did Not Attend  Lynelle SmokeCara Travis Hansen Family HospitalMadoni 05/30/2016, 4:34 PM

## 2016-05-31 DIAGNOSIS — F23 Brief psychotic disorder: Secondary | ICD-10-CM

## 2016-05-31 LAB — RPR: RPR Ser Ql: NONREACTIVE

## 2016-05-31 NOTE — Progress Notes (Signed)
Recreation Therapy Notes  Date: 09.15.17 Time: 1:00 pm Location: Craft Room  Group Topic: Leisure Education  Goal Area(s) Addresses:  Patient will identify activities for each letter of the alphabet. Patient will verbalize ability to integrate positive leisure into life post d/c. Patient will verbalize ability to use leisure as a Associate Professorcoping skill.  Behavioral Response: Attentive  Intervention: Leisure Alphabet  Activity: Patients were given a Leisure Information systems managerAlphabet worksheet and instructed to pick healthy leisure activities for each letter of the alphabet.  Education: LRT educated patients on what they need to participate in leisure activities.  Education Outcome: In group clarification offered  Clinical Observations/Feedback: Patient wrote healthy leisure activities with assistance from interpreter. Patient did not contribute to group discussion.  Jacquelynn CreeGreene,Aalaya Yadao M, LRT/CTRS 05/31/2016 1:59 PM

## 2016-05-31 NOTE — [facility] Group Notes (Signed)
[facility] Group Notes:  (Nursing/MHT/Case Management/Adjunct)  Date:  05/31/2016  Time:  3:05 AM  Type of Therapy:  Group Therapy  Participation Level:  Minimal  Participation Quality:  Attentive  Affect:  Appropriate  Cognitive:  Appropriate  Insight:  Appropriate  Engagement in Group:  Supportive  Modes of Intervention:  n/a  Summary of Progress/Problems: Pt sat in group and colored. Did not interact much because of the language barrier.   Rebekah [name] 05/31/2016, 3:05 AM
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
## 2016-05-31 NOTE — Plan of Care (Signed)
Problem: Coping: Goal: Ability to verbalize feelings will improve Outcome: Progressing Verbalizes feelings well with the utilization of interpreter.

## 2016-05-31 NOTE — Progress Notes (Signed)
D: Pt denies SI/HI/AVH via interpreter,  Patient also denies pain or discomfort. Pt is pleasant and cooperative with treatment plan no bizarre behavior noted. Pt stated she feels better from resting and appears less anxious. Patient has limited interaction with peers because of language barrier.  A: Pt was offered support and encouragement. Pt was given scheduled medications. Pt was encouraged to attend groups. Q 15 minute checks were done for safety.  R:Pt attends groups, minimal interaction with peers and staff. Pt is taking medication. Pt has no complaints.Pt receptive to treatment and safety maintained on unit.

## 2016-05-31 NOTE — [facility] Suicide Risk Assessment (Signed)
[facility] INPATIENT:  Family/Significant Other Suicide Prevention Education  Patient's family required spanish translator - Dr. Ardyth HarpsHernandez assisted with completing [facility] Suicide Prevention Education:  Education Completed; daughter, [name] ph#: [phone]) [phone] has been identified by the patient as the family member/significant other with whom the patient will be residing, and identified as the person(s) who will aid the patient in the event of a mental health crisis (suicidal ideations/suicide attempt).  With written consent from the patient, the family member/significant other has been provided the following suicide prevention education, prior to the and/or following the discharge of the patient.  The suicide prevention education provided includes the following:  Suicide risk factors  Suicide prevention and interventions  National Suicide Hotline telephone number  [facility] telephone [facility]  [facility]  [facility] and/or [facility] telephone number  Request made of family/significant other to:  Remove weapons (e.g., guns, rifles, knives), all items previously/currently identified as safety concern.    Remove drugs/medications (over-the-counter, prescriptions, illicit drugs), all items previously/currently identified as a safety concern.  The family member/significant other verbalizes understanding of the suicide prevention education information provided.  The family member/significant other agrees to remove the items of safety concern listed above.  Lynden OxfordKadijah R Chaise [name], MSW, LCSW-A 05/31/2016, 12:26 PM

## 2016-05-31 NOTE — Tx Team (Signed)
Interdisciplinary Treatment and Diagnostic Plan Update  05/31/2016 Time of Session: 11:09 AM  Rebekah [name] MRN: [id number]  Principal Diagnosis: Bipolar affective, manic (HCC)  Secondary Diagnoses: Principal Problem:   Bipolar affective, manic (HCC)   Current Medications:  Current Facility-Administered Medications  Medication Dose Route Frequency Provider Last Rate Last Dose  . acetaminophen (TYLENOL) tablet 650 mg  650 mg Oral Q6H PRN Audery AmelJohn T Clapacs, MD      . alum & mag hydroxide-simeth (MAALOX/MYLANTA) 200-200-20 MG/5ML suspension 30 mL  30 mL Oral Q4H PRN Audery AmelJohn T Clapacs, MD      . amLODipine (NORVASC) tablet 5 mg  5 mg Oral Daily Jolanta B Pucilowska, MD   5 mg at 05/31/16 0902  . LORazepam (ATIVAN) tablet 0.5 mg  0.5 mg Oral QHS Jimmy FootmanAndrea [name], MD   0.5 mg at 05/30/16 2212  . magnesium hydroxide (MILK OF MAGNESIA) suspension 30 mL  30 mL Oral Daily PRN Audery AmelJohn T Clapacs, MD      . risperiDONE (RISPERDAL) tablet 1 mg  1 mg Oral BID Jimmy FootmanAndrea [name], MD   1 mg at 05/31/16 0902    PTA Medications: No prescriptions prior to admission.    Treatment Modalities: Medication Management, Group therapy, Case management,  1 to 1 session with clinician, Psychoeducation, Recreational therapy.   Physician Treatment Plan for Primary Diagnosis: Bipolar affective, manic (HCC) Long Term Goal(s): Improvement in symptoms so as ready for discharge  Short Term Goals: Ability to identify changes in lifestyle to reduce recurrence of condition will improve, Ability to verbalize feelings will improve, Compliance with prescribed medications will improve and Ability to identify triggers associated with substance abuse/mental health issues will improve  Medication Management: Evaluate patient's response, side effects, and tolerance of medication regimen.  Therapeutic Interventions: 1 to 1 sessions, Unit Group sessions and Medication administration.  Evaluation of Outcomes:  Progressing  Physician Treatment Plan for Secondary Diagnosis: Principal Problem:   Bipolar affective, manic (HCC)   Long Term Goal(s): Improvement in symptoms so as ready for discharge  Short Term Goals: Ability to identify changes in lifestyle to reduce recurrence of condition will improve, Ability to verbalize feelings will improve, Ability to demonstrate self-control will improve, Compliance with prescribed medications will improve and Ability to identify triggers associated with substance abuse/mental health issues will improve  Medication Management: Evaluate patient's response, side effects, and tolerance of medication regimen.  Therapeutic Interventions: 1 to 1 sessions, Unit Group sessions and Medication administration.  Evaluation of Outcomes: Progressing   RN Treatment Plan for Primary Diagnosis: Bipolar affective, manic (HCC) Long Term Goal(s): Knowledge of disease and therapeutic regimen to maintain health will improve  Short Term Goals: Ability to remain free from injury will improve, Ability to identify and develop effective coping behaviors will improve and Compliance with prescribed medications will improve  Medication Management: RN will administer medications as ordered by provider, will assess and evaluate patient's response and provide education to patient for prescribed medication. RN will report any adverse and/or side effects to prescribing provider.  Therapeutic Interventions: 1 on 1 counseling sessions, Psychoeducation, Medication administration, Evaluate responses to treatment, Monitor vital signs and CBGs as ordered, Perform/monitor CIWA, COWS, AIMS and Fall Risk screenings as ordered, Perform wound care treatments as ordered.  Evaluation of Outcomes: Progressing   LCSW Treatment Plan for Primary Diagnosis: Bipolar affective, manic (HCC) Long Term Goal(s): Safe transition to appropriate next level of care at discharge, Engage patient in therapeutic group  addressing interpersonal concerns.  Short Term Goals:  Engage patient in aftercare planning with referrals and resources, Increase social support, Increase ability to appropriately verbalize feelings, Increase emotional regulation, Facilitate acceptance of mental health diagnosis and concerns and Increase skills for wellness and recovery  Therapeutic Interventions: Assess for all discharge needs, 1 to 1 time with Social worker, Explore available resources and support systems, Assess for adequacy in community support network, Educate family and significant other(s) on suicide prevention, Complete Psychosocial Assessment, Interpersonal group therapy.  Evaluation of Outcomes: Progressing   Progress in Treatment: Attending groups: CSW still assessing, pt new to milieu Participating in groups: CSW still assessing, pt new to milieu Taking medication as prescribed: Yes, MD continues to assess for medication changes as needed Toleration medication: Yes, no side effects reported at this time Family/Significant other contact made: CSW, still assessing for appropriate contacts Patient understands diagnosis: Yes Discussing patient identified problems/goals with staff: Yes Medical problems stabilized or resolved:  Yes   Denies suicidal/homicidal ideation: Yes Issues/concerns per patient self-inventory: None Other: N/A  New problem(s) identified: None identified at this time.   New Short Term/Long Term Goal(s): None identified at this time.   Discharge Plan or Barriers: Pt [facility] and will follow up with ___ for medication management and therapy   Reason for Continuation of Hospitalization: Anxiety Depression Hallucinations Delusions  Mania  Estimated Length of Stay/Date of discharge: 3-5 days  Attendees:  Patient:  05/31/2016  11:12 AM  Physician: Dr. Ardyth Harps, MD 05/31/2016  11:12 AM  Nursing: Hulan Amato, RN 05/31/2016  11:12 AM     Social Worker: Hampton Abbot  LCSWA 05/31/2016  11:12 AM  Recreational Therapist: Hershal Coria, LRT 05/31/2016  11:12 AM   05/31/2016  11:12 AM   05/31/2016  11:12 AM  Other: 05/31/2016  11:12 AM    Scribe for Treatment Team:  Lynden Oxford, MSW, LCSWA

## 2016-05-31 NOTE — [facility] Group Notes (Signed)
[facility] LCSW Group Therapy   05/31/2016 9:30 AM   Type of Therapy: Group Therapy: Feelings around relapse    Participation Level: Patient invited but did not attend.  Participation Quality: Patient invited but did not attend.   Hampton AbbotKadijah Keyara Ent, MSW, Theresia MajorsLCSWA

## 2016-05-31 NOTE — Plan of Care (Signed)
Problem: Coping: Goal: Ability to verbalize feelings will improve Outcome: Progressing Patient verbalized feelings.    

## 2016-06-01 DIAGNOSIS — F23 Brief psychotic disorder: Secondary | ICD-10-CM

## 2016-06-01 NOTE — [facility] Group Notes (Signed)
[facility] Group Notes:  (Nursing/MHT/Case Management/Adjunct)  Date:  06/01/2016  Time:  2:59 AM  Type of Therapy:  Psychoeducational Skills  Participation Level:  Minimal  Participation Quality:  Attentive  Affect:  Appropriate  Cognitive:  Alert  Insight:  Limited  Engagement in Group:  Limited  Modes of Intervention:  Discussion, Socialization and Support  Summary of Progress/Problems: Patient was not able to participate in group discussion due to language barriers.  Rebekah MilroyLaquanda Y Jayquon Theiler 06/01/2016, 2:59 AM

## 2016-06-01 NOTE — Progress Notes (Signed)
With assistance of interpreter: Patient denies SI/HI/AVH.   Denies depression.  Smiles readily. Visible in the milieu.  Good appetite.  Maintains personal care chores.

## 2016-06-01 NOTE — Progress Notes (Signed)
D: Pt denies SI/HI/AVH via interpreter. Pt is pleasant and cooperative. Pt  appears less anxious and she is interacting with peers and staff appropriately.  A: Pt was offered support and encouragement. Pt was given scheduled medications. Pt was encouraged to attend groups. Q 15 minute checks were done for safety.  R:Pt attends groups and interacts well with peers and staff. Pt is taking medication. Pt has no complaints.Pt receptive to treatment and safety maintained on unit.

## 2016-06-01 NOTE — [facility] Group Notes (Signed)
[facility] LCSW Group Therapy  06/01/2016 2:44 PM  Type of Therapy:  Group Therapy  Participation Level:  Active  Participation Quality:  Attentive  Affect:  Appropriate  Cognitive:  Alert  Insight:  Improving  Engagement in Therapy:  Engaged  Modes of Intervention:  Activity, Discussion, Education and Support  Summary of Progress/Problems: Safety Planning: Patients identified fears or worries surrounding discharge. Patients offered support to their peers and openly developed safety plans for their individual needs. Patients developed their own safety plan. Patients discussed their warning signs, coping strategies, support system with family and friends, identified mental health professionals, and how to keep their environments safe (ex. Removing unnecessary medications or removing weapons/guns). Patients then discussed their personalized safety plan with the group. Pt was actively engaged with the help of her interpretor assisting with communication. Pt stated she has the support from family and relies on her faith to help cope when she isn't feeling her best.    Fredrich BirksAmaris G. Garnette CzechSampson MSW, LCSWA 06/01/2016, 2:47 PM
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
## 2016-06-02 NOTE — Progress Notes (Signed)
D: Observed pt in dayroom appearing to watch television. Patient denies SI/HI/AVH. Pt affect is anxious. Writer used assistance of interpretor for assessment this evening  Patient alert and oriented to person and place. Pt disoriented to time and situation. When asked about the date, the pt could not recall, and when asked about her reason for being here pt could not adequately answer. Pt stated her mood was "fine...sleeping well." Pt would be very tangential when asked questions through interpretor and would have a flight of idea at times. When asked about anxiety and depression the only applicable answer given was "I don't feel bad." Pt did indicate that she was "grateful for everyone here." A: Offered active listening and support. Provided therapeutic communication. Administered scheduled medications. Encouraged pt to attend groups and actively participate in care.  R: Pt pleasant and cooperative. Pt medication compliant. Will continue Q15 min. checks. Safety maintained.

## 2016-06-02 NOTE — Plan of Care (Signed)
Problem: Safety: Goal: Ability to remain free from injury will improve Outcome: Progressing Pt has remained free from injury   

## 2016-06-02 NOTE — [facility] Group Notes (Signed)
[facility] LCSW Group Therapy  06/02/2016 2:27 PM  Type of Therapy:  Group Therapy  Participation Level:  Active  Participation Quality:  Appropriate  Affect:  Appropriate  Cognitive:  Alert  Insight:  Engaged  Engagement in Therapy:  Engaged  Modes of Intervention:  Discussion, Education and Support  Summary of Progress/Problems:Self esteem: Patients discussed self esteem and how it impacts them. They discussed what aspects in their lives has influenced their self esteem. They were challenged to identify changes that are needed in order to improve self esteem. Patients participated in activity where they had to identify positive adjectives they felt described their personality. Patients shared with the group on the following areas: Things I am good at, What I like about my appearance, I've helped others by, What I value the most, compliments I have received, challenges I have overcome, thing that make me unique, and Times I've made others happy. Pt stated she wants to practice more forgiveness to encourage more positive relationships with her family. Patient states prayer improves her self-esteem.   Rebekah [name] G. Garnette CzechSampson MSW, LCSWA 06/02/2016, 2:28 PM

## 2016-06-02 NOTE — Progress Notes (Signed)
Pt has been pleasant and cooperative.Pt denies SI and A/V hallucinations per Spanish language interpreter . Pt has been active on the unit.

## 2016-06-02 NOTE — Progress Notes (Signed)
[facility] MD Progress Note  06/02/2016 2:31 PM Rebekah [name]  MRN:  [id number] Subjective:  Follow-up for today Sunday the 17th. Met with the patient again with a hospital provided [place] language interpreter. Patient had no complaints. She knew that she was in the hospital. She seems to of been able to interact with some degree of appropriateness with staff and nurses. She is able to sit still during groups. She has been eating and compliant with treatment. Her hygiene looks good. In discussion her answers remain vague and mostly consisting of prayers and references to God and the [place].. She denies any depression and suicidality and does not appear to be necessarily responding to internal stimuli although she often seems to be in something of a world of her own. No dangerous behavior. Not really able to articulate as much of a plan about discharge. No physical complaints. Per nursing: Pt denies SI/HI/AVH via interpreter,  Patient also denies pain or discomfort. Pt is pleasant and cooperative with treatment plan no bizarre behavior noted. Pt stated she feels better from resting and appears less anxious. Patient has limited interaction with peers because of language barrier.   Principal Problem: Brief psychotic disorder Diagnosis:   Patient Active Problem List   Diagnosis Date Noted  . 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 Hypertension    History reviewed. No pertinent surgical history. Family History: History reviewed. No pertinent family history. Family Psychiatric  History:  Social History:  History  Alcohol Use No     History  Drug Use No    Social History   Social History  . Marital status: Single    Spouse name: N/A  . Number of children: N/A  . Years of education: N/A   Social History Main Topics  . Smoking status: Never  Smoker  . Smokeless tobacco: Never Used  . Alcohol use No  . Drug use: No  . Sexual activity: Not Asked   Other Topics Concern  . None   Social History Narrative  . None     Current Medications: Current Facility-Administered Medications  Medication Dose Route Frequency Provider Last Rate Last Dose  . acetaminophen (TYLENOL) tablet 650 mg  650 mg Oral Q6H PRN Gonzella Lex, MD      . alum & mag hydroxide-simeth (MAALOX/MYLANTA) 200-200-20 MG/5ML suspension 30 mL  30 mL Oral Q4H PRN Gonzella Lex, MD      . amLODipine (NORVASC) tablet 5 mg  5 mg Oral Daily Clovis Fredrickson, MD   5 mg at 06/02/16 0834  . LORazepam (ATIVAN) tablet 0.5 mg  0.5 mg Oral QHS Hildred Priest, MD   0.5 mg at 06/01/16 2248  . magnesium hydroxide (MILK OF MAGNESIA) suspension 30 mL  30 mL Oral Daily PRN Gonzella Lex, MD      . risperiDONE (RISPERDAL) tablet 1 mg  1 mg Oral BID Hildred Priest, MD   1 mg at 06/02/16 0277    Lab Results:  No results found for this or any previous visit (from the past 47 hour(s)).  Blood Alcohol level:  Lab Results  Component Value Date   ETH <5 41/28/7867    Metabolic Disorder Labs: Lab Results  Component Value Date   HGBA1C 6.1 (H) 05/29/2016   MPG 128 05/29/2016   Lab Results  Component Value Date  PROLACTIN 17.5 05/29/2016   Lab Results  Component Value Date   CHOL 172 05/29/2016   TRIG 505 (H) 05/29/2016   HDL 35 (L) 05/29/2016   CHOLHDL 4.9 05/29/2016   VLDL UNABLE TO CALCULATE IF TRIGLYCERIDE OVER 400 mg/dL 05/29/2016   LDLCALC UNABLE TO CALCULATE IF TRIGLYCERIDE OVER 400 mg/dL 05/29/2016    Physical Findings: AIMS: Facial and Oral Movements Muscles of Facial Expression: None, normal Lips and Perioral Area: None, normal Jaw: None, normal Tongue: None, normal,Extremity Movements Upper (arms, wrists, hands, fingers): None, normal Lower (legs, knees, ankles, toes): None, normal, Trunk Movements Neck, shoulders, hips:  None, normal, Overall Severity Severity of abnormal movements (highest score from questions above): None, normal Incapacitation due to abnormal movements: None, normal Patient's awareness of abnormal movements (rate only patient's report): No Awareness, Dental Status Current problems with teeth and/or dentures?: No Does patient usually wear dentures?: No  CIWA:    COWS:     Musculoskeletal: Strength & Muscle Tone: within normal limits Gait & Station: normal Patient leans: N/A  Psychiatric Specialty Exam: Physical Exam  Nursing note and vitals reviewed. Constitutional: She is oriented to person, place, and time. She appears well-developed and well-nourished.  HENT:  Head: Normocephalic and atraumatic.  Eyes: EOM are normal.  Neck: Normal range of motion.  Cardiovascular: Regular rhythm.   Respiratory: Effort normal.  Neurological: She is alert and oriented to person, place, and time.  Psychiatric: She has a normal mood and affect. Her speech is normal and behavior is normal. Judgment and thought content normal. Cognition and memory are impaired. She exhibits abnormal recent memory and abnormal remote memory.    Review of Systems  Constitutional: Negative.   HENT: Negative.   Eyes: Negative.   Respiratory: Negative.   Cardiovascular: Negative.   Gastrointestinal: Negative.   Genitourinary: Negative.   Musculoskeletal: Negative.   Skin: Negative.   Neurological: Negative.   Endo/Heme/Allergies: Negative.   Psychiatric/Behavioral: Negative.     Blood pressure 119/76, pulse 85, temperature 98.2 F (36.8 C), temperature source Oral, resp. rate 18, height 5' (1.524 m), weight 58.1 kg (128 lb), SpO2 98 %.Body mass index is 25 kg/m.  General Appearance: Fairly Groomed  Eye Contact:  Good  Speech:  Clear and Coherent  Volume:  Normal  Mood:  Anxious  Affect:  Appropriate  Thought Process:  Disorganized and Descriptions of Associations: Loose  Orientation:  Full (Time, Place,  and Person)  Thought Content:  Hallucinations: None  Suicidal Thoughts:  No  Homicidal Thoughts:  No  Memory:  Immediate;   Fair Recent;   Fair Remote;   Fair  Judgement:  Poor  Insight:  Shallow  Psychomotor Activity:  Normal  Concentration:  Concentration: Poor and Attention Span: Poor  Recall:  [employer] of Knowledge:  Poor  Language:  Fair  Akathisia:  No  Handed:    AIMS (if indicated):     Assets:  Chief Executive Officer Social Support  ADL's:  Intact  Cognition:  WNL  Sleep:  Number of Hours: 6.5     Treatment Plan Summary: No change to treatment plan. No change to medicine. Patient appears to be fairly stable. She is smiling and hyper religious. Not clear to me whether she is having manic like symptoms or if she is just happy in her dementia. As I said, no change to treatment today. Continue involvement in groups and individual therapy.Marland Kitchen  Appears patient had a psychotic episode: symptoms only present for a few days prior  to her arrival to ER. She does not have any prior history of mental illness or hospitalizations. She has been started on  Risperdal 1 mg by mouth twice a day  Her insomnia: Patient slept 6 hours last night. Continue Ativan 0.5 mg at bedtime  Patient reports history of hypertension: She has been is started on Norvasc 5 mg by mouth daily. BP well controlled no change to medicine  Diet low sodium  Precautions every 15 minute checks  Hospitalization and status continue involuntary commitment  Labs patient will need a lipid panel and hemoglobin A1c. This is her first episode of bizarre behavior and psychosis. RPR - , ammonia wnl , B12 wnl , HIV - and TSH wnl  Head CT  within the normal limits  EKG was wnl  Family plans to come and visit pt this weekend. On Monday we will talk again and see if pt back at baseline.  Alethia Berthold, MD 06/02/2016, 2:31 PM

## 2016-06-03 MED ORDER — AMLODIPINE BESYLATE 5 MG PO TABS: 5.0000 mg | ORAL_TABLET | Freq: Every day | ORAL | 0 refills | Status: AC

## 2016-06-03 MED ORDER — RISPERIDONE 2 MG PO TABS: 2.0000 mg | ORAL_TABLET | Freq: Every day | ORAL | 0 refills | Status: AC

## 2016-06-03 NOTE — Plan of Care (Signed)
Problem: Safety: Goal: Ability to remain free from injury will improve Outcome: Progressing Patient cooperative with plan of care.
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
## 2016-06-03 NOTE — [facility] Group Notes (Signed)
[facility] Group Notes:  (Nursing/MHT/Case Management/Adjunct)  Date:  06/03/2016  Time:  12:47 AM  Type of Therapy:  Psychoeducational Skills  Participation Level:  Did Not Attend  Participation Quality:   Summary of Progress/Problems:  Mayra NeerJackie L Tatianna Ibbotson 06/03/2016, 12:47 AM

## 2016-06-03 NOTE — Tx Team (Signed)
Interdisciplinary Treatment and Diagnostic Plan Update  06/03/2016 Time of Session: 5:15 PM  Rebekah [name] MRN: [id number]  Principal Diagnosis: Brief psychotic disorder  Secondary Diagnoses: Principal Problem:   Brief psychotic disorder   Current Medications:  Current Facility-Administered Medications  Medication Dose Route Frequency Provider Last Rate Last Dose  . acetaminophen (TYLENOL) tablet 650 mg  650 mg Oral Q6H PRN Audery Amel, MD      . alum & mag hydroxide-simeth (MAALOX/MYLANTA) 200-200-20 MG/5ML suspension 30 mL  30 mL Oral Q4H PRN Audery Amel, MD      . amLODipine (NORVASC) tablet 5 mg  5 mg Oral Daily Jolanta B Pucilowska, MD   5 mg at 06/03/16 0845  . LORazepam (ATIVAN) tablet 0.5 mg  0.5 mg Oral QHS [name], MD   0.5 mg at 06/02/16 2219  . magnesium hydroxide (MILK OF MAGNESIA) suspension 30 mL  30 mL Oral Daily PRN Audery Amel, MD      . risperiDONE (RISPERDAL) tablet 1 mg  1 mg Oral BID [name], MD   1 mg at 06/03/16 0845   Current Outpatient Prescriptions  Medication Sig Dispense Refill  . [START ON 06/04/2016] amLODipine (NORVASC) 5 MG tablet Take 1 tablet (5 mg total) by mouth daily. 30 tablet 0  . risperiDONE (RISPERDAL) 2 MG tablet Take 1 tablet (2 mg total) by mouth at bedtime. 30 tablet 0    PTA Medications: No prescriptions prior to admission.    Treatment Modalities: Medication Management, Group therapy, Case management,  1 to 1 session with clinician, Psychoeducation, Recreational therapy.   Physician Treatment Plan for Primary Diagnosis: Brief psychotic disorder Long Term Goal(s): Improvement in symptoms so as ready for discharge  Short Term Goals: Ability to identify changes in lifestyle to reduce recurrence of condition will improve, Ability to verbalize feelings will improve, Compliance with prescribed medications will improve and Ability to identify triggers associated with substance  abuse/mental health issues will improve  Medication Management: Evaluate patient's response, side effects, and tolerance of medication regimen.  Therapeutic Interventions: 1 to 1 sessions, Unit Group sessions and Medication administration.  Evaluation of Outcomes: Adequate for discharge  Physician Treatment Plan for Secondary Diagnosis: Principal Problem:   Brief psychotic disorder   Long Term Goal(s): Improvement in symptoms so as ready for discharge  Short Term Goals: Ability to identify changes in lifestyle to reduce recurrence of condition will improve, Ability to verbalize feelings will improve, Ability to demonstrate self-control will improve, Compliance with prescribed medications will improve and Ability to identify triggers associated with substance abuse/mental health issues will improve  Medication Management: Evaluate patient's response, side effects, and tolerance of medication regimen.  Therapeutic Interventions: 1 to 1 sessions, Unit Group sessions and Medication administration.  Evaluation of Outcomes: Adequate for discharge   RN Treatment Plan for Primary Diagnosis: Brief psychotic disorder Long Term Goal(s): Knowledge of disease and therapeutic regimen to maintain health will improve  Short Term Goals: Ability to remain free from injury will improve, Ability to identify and develop effective coping behaviors will improve and Compliance with prescribed medications will improve  Medication Management: RN will administer medications as ordered by provider, will assess and evaluate patient's response and provide education to patient for prescribed medication. RN will report any adverse and/or side effects to prescribing provider.  Therapeutic Interventions: 1 on 1 counseling sessions, Psychoeducation, Medication administration, Evaluate responses to treatment, Monitor vital signs and CBGs as ordered, Perform/monitor CIWA, COWS, AIMS and Fall Risk screenings  as ordered,  Perform wound care treatments as ordered.  Evaluation of Outcomes: Adequate for discharge   LCSW Treatment Plan for Primary Diagnosis: Brief psychotic disorder Long Term Goal(s): Safe transition to appropriate next level of care at discharge, Engage patient in therapeutic group addressing interpersonal concerns.  Short Term Goals: Engage patient in aftercare planning with referrals and resources, Increase social support, Increase ability to appropriately verbalize feelings, Increase emotional regulation, Facilitate acceptance of mental health diagnosis and concerns and Increase skills for wellness and recovery  Therapeutic Interventions: Assess for all discharge needs, 1 to 1 time with Social worker, Explore available resources and support systems, Assess for adequacy in community support network, Educate family and significant other(s) on suicide prevention, Complete Psychosocial Assessment, Interpersonal group therapy.  Evaluation of Outcomes: Adequate for discharge   Progress in Treatment: Attending groups: CSW still assessing, pt new to milieu Participating in groups: CSW still assessing, pt new to milieu Taking medication as prescribed: Yes, MD continues to assess for medication changes as needed Toleration medication: Yes, no side effects reported at this time Family/Significant other contact made: CSW, still assessing for appropriate contacts Patient understands diagnosis: Yes Discussing patient identified problems/goals with staff: Yes Medical problems stabilized or resolved:  Yes   Denies suicidal/homicidal ideation: Yes Issues/concerns per patient self-inventory: None Other: N/A  New problem(s) identified: None identified at this time.   New Short Term/Long Term Goal(s): None identified at this time.   Discharge Plan or Barriers: [place] and will follow up with ___ for medication management and therapy   Reason for Continuation of  Hospitalization: Anxiety Depression Hallucinations Delusions  Mania  Estimated Length of Stay/Date of discharge: 3-5 days  Attendees:  Patient:  05/31/2016  11:12 AM  Physician: Dr. Ardyth HarpsHernandez, MD 05/31/2016  11:12 AM  Nursing: Hulan AmatoGwen Farrish, RN 05/31/2016  11:12 AM     Social Worker: Hampton AbbotKadijah Grant LCSWA 05/31/2016  11:12 AM  Recreational Therapist: Hershal CoriaBeth Greene, LRT 05/31/2016  11:12 AM   05/31/2016  11:12 AM   05/31/2016  11:12 AM  Other: 05/31/2016  11:12 AM    Scribe for Treatment Team:  Lynden OxfordKadijah R. Grant, MSW, LCSWA  Update by York GriceJonathan Ivanell Deshotel, LCSWA, LCAs on 06/03/16

## 2016-06-03 NOTE — Progress Notes (Signed)
Patient discharged to daughter and transportation in taxi. Phone interpretor utilized for discharge instructions. Verbalized understanding rt recommended discharge plan of care. Acknowledged return of all belongings. Safety maintained.
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
## 2016-06-03 NOTE — Progress Notes (Signed)
  [facility] :  Will you be returning to the same living situation after discharge:  Yes,  [place] with [place] daughter At discharge, do you have transportation home?: Yes,  pt will be picked up by her daughter Do you have the ability to pay for your medications: Yes,  pt will be provided with prescriptions at discharge  Release of information consent forms completed and in the chart;  Patient's signature needed at discharge.  Patient to Follow up at: Follow-up Information    [facility] .   Why:  [facility] at 10:20am on Wednesday September 20th, 2017 for medication management and to be assessed for therapy.  Por favor llegue al [facility] a las 10:20 am el mircoles 20     Contact information: [address] [place] [address] [phone]        [facility] .   Why:  Please arrive to the walk in clinic Monday-Friday from 9am-12:30pm and 1:30pm-4:30 for asistance with your application for free or low-cost medications. Por favor llegue a la caminata en la clnica de lunes a viernes de 9 am a 12:30 pm y de 1:30 pm a 4:30 Contact information: [facility]  [facility] [address], [address] [place], [place] [address] Phone: [phone] Fax: [phone]          Next level of care provider has access to [facility] Link:no  Safety Planning and Suicide Prevention discussed: Yes,  completed with pt  Have you used any form of tobacco in the last 30 days? (Cigarettes, Smokeless Tobacco, Cigars, and/or Pipes): No  Has patient been referred to the Quitline?: N/A patient is not a smoker  Patient has been referred for addiction treatment: N/A  Mercy RidingJonathan F Filbert Craze 06/03/2016, 5:17 PM

## 2016-06-03 NOTE — [facility] Group Notes (Signed)
[facility] LCSW Group Therapy   06/03/2016 9:30AM Type of Therapy: Group Therapy    Modes of Intervention: Clarification, Confrontation, Discussion, Education, Exploration,  Limit-setting, Orientation, Problem-solving, Rapport Building, Dance movement psychotherapisteality Testing, Socialization and Support   Summary of Progress/Problems: Pt identified obstacles faced currently and processed barriers involved in overcoming these obstacles. Pt identified steps necessary for overcoming these obstacles and explored motivation (internal and external) for facing these difficulties head on. Pt further identified one area of concern in their lives and chose a goal to focus on for today. Pt attended group but unable to participate without translator.    Hampton AbbotKadijah Lidie Glade, MSW, LCSW-A

## 2016-06-03 NOTE — Plan of Care (Signed)
Problem: Safety: Goal: Ability to redirect hostility and anger into socially appropriate behaviors will improve Outcome: Progressing Pt has not been hostile.

## 2016-06-03 NOTE — Progress Notes (Signed)
Patient with appropriate affect, cooperative behavior with meals, meds and plan of care. No SI/HI at this time. Assessment and evaluation with interpretor. MD into visit. Patient aware of upcoming discharge. Appropriate when staff initiates interaction. Aware to request interpretor from writer for any needs or concerns. Safety maintained.

## 2016-11-22 ENCOUNTER — Other Ambulatory Visit: Payer: Self-pay | Admitting: Family Medicine

## 2016-11-22 DIAGNOSIS — Z1231 Encounter for screening mammogram for malignant neoplasm of breast: Secondary | ICD-10-CM

## 2016-12-23 ENCOUNTER — Ambulatory Visit: Admission: RE | Admit: 2016-12-23 | Payer: No Typology Code available for payment source | Source: Ambulatory Visit

## 2017-02-04 IMAGING — CT CT HEAD W/O CM
3 series · 15 of 43 positions shown, 18 images · non-contrast
Comparison: None.

CLINICAL DATA: 69-year-old female with new onset of manic episodes.
Hypertension. Initial encounter.

EXAM:
CT HEAD WITHOUT CONTRAST
TECHNIQUE: Contiguous axial images were obtained from the base of the skull
through the vertex without intravenous contrast.

[Series 2: head wo · axial · 0.47mm/px · z∈[-137,-32]mm · 9 of 26 slices shown, 12 images]
[im 3/26  brain]
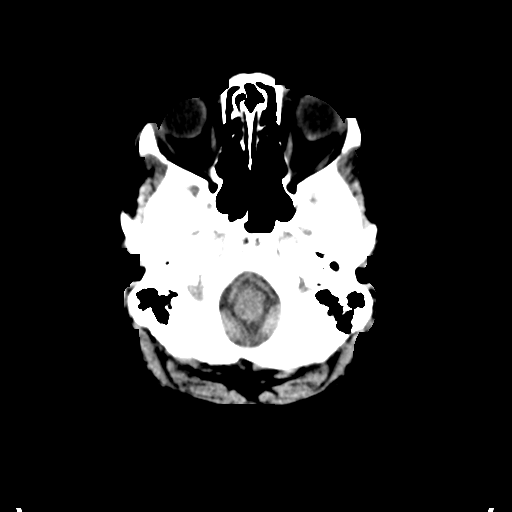
[im 3/26  bone]
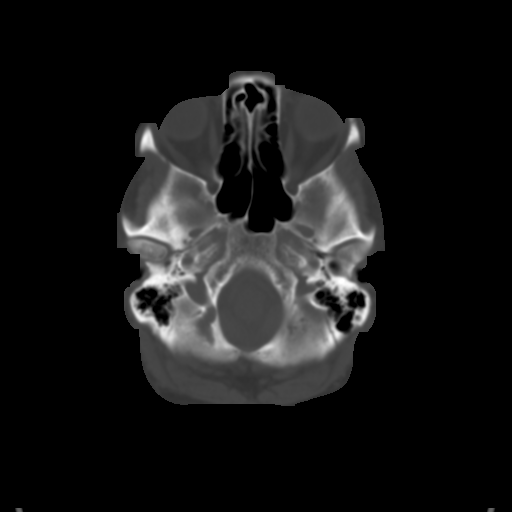
[im 6/26  brain]
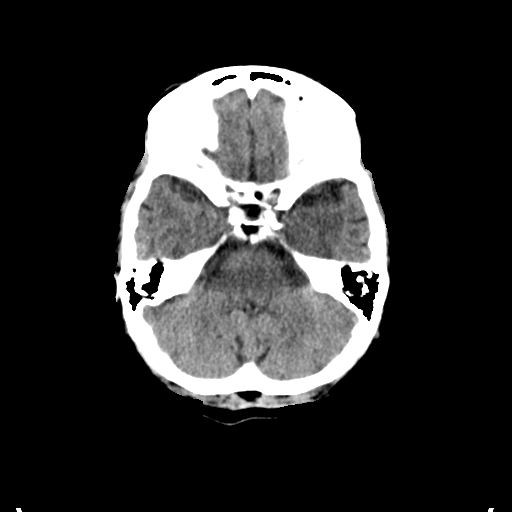
[im 8/26  brain]
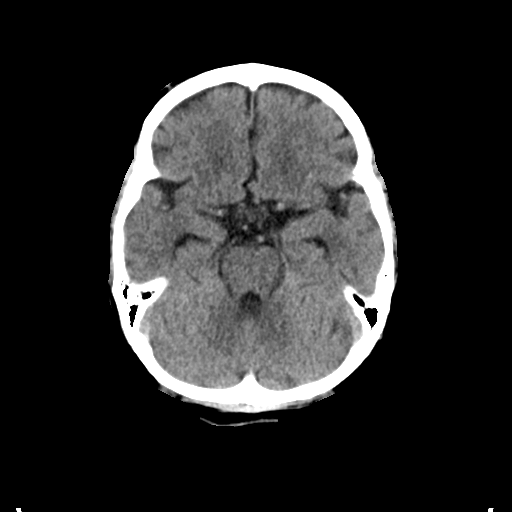
[im 11/26  brain]
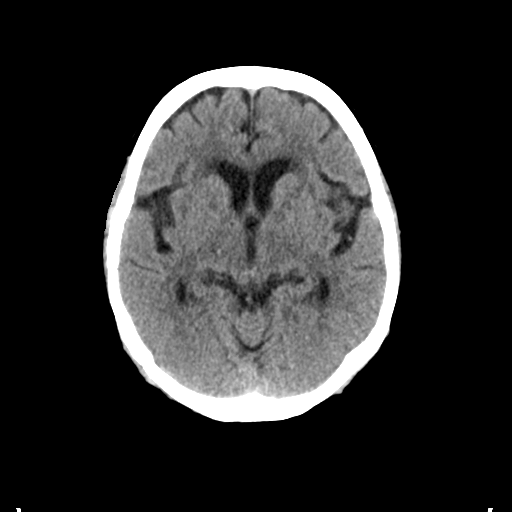
[im 14/26  brain]
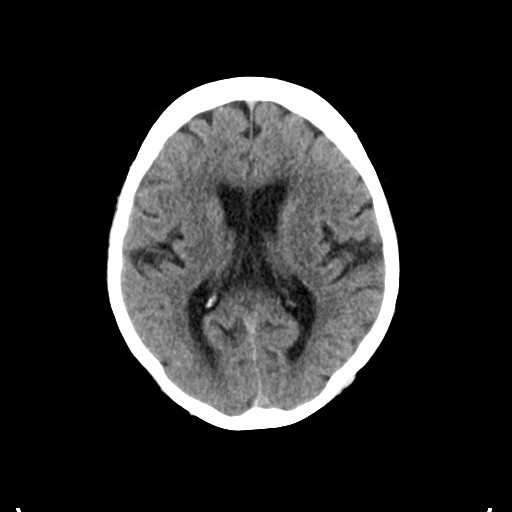
[im 14/26  bone]
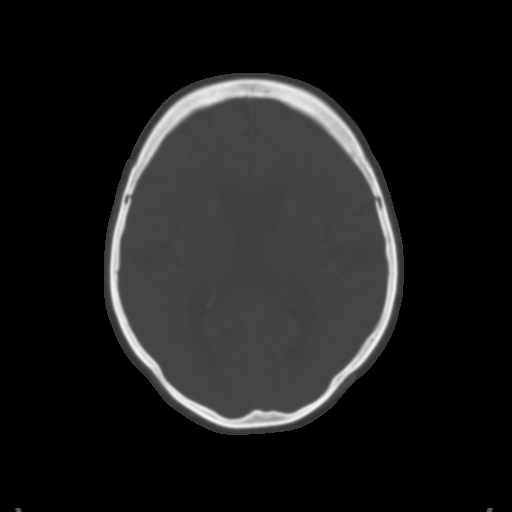
[im 16/26  brain]
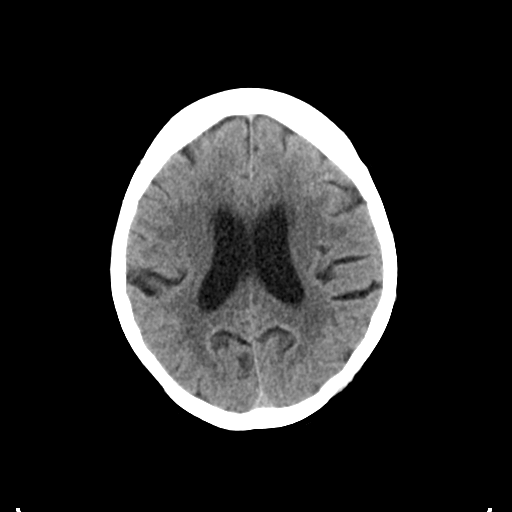
[im 19/26  brain]
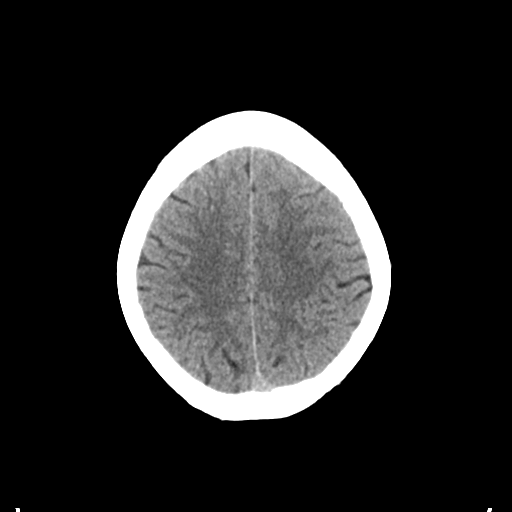
[im 22/26  brain]
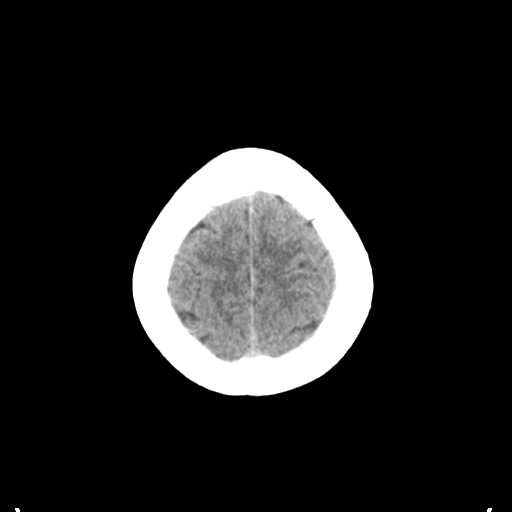
[im 24/26  brain]
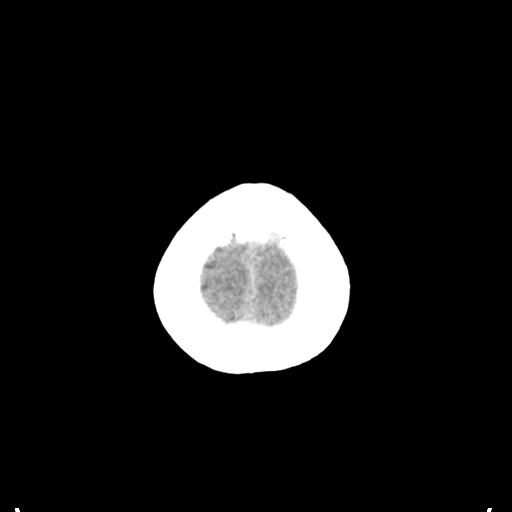
[im 24/26  bone]
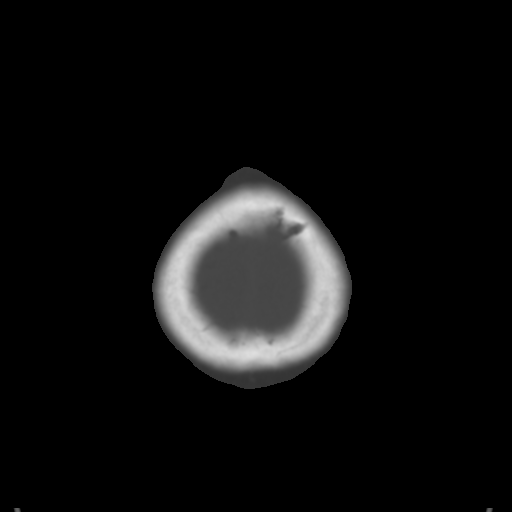

[Series 4: coronal soft tissue · coronal · 0.25mm/px · 3 of 58 slices shown]
[im 20/58  brain]
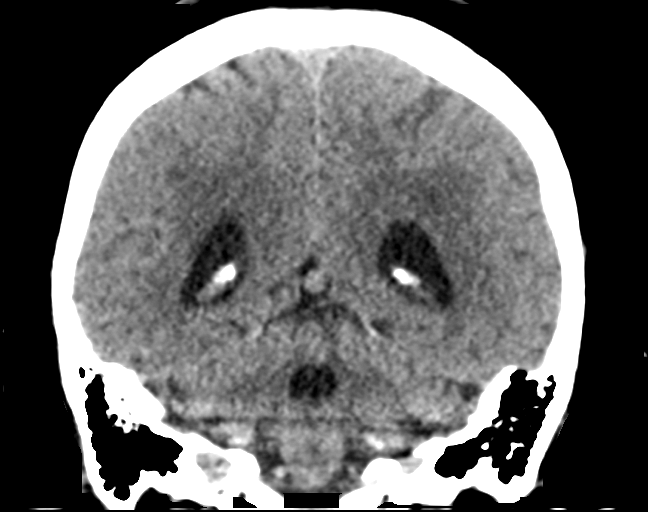
[im 26/58  brain]
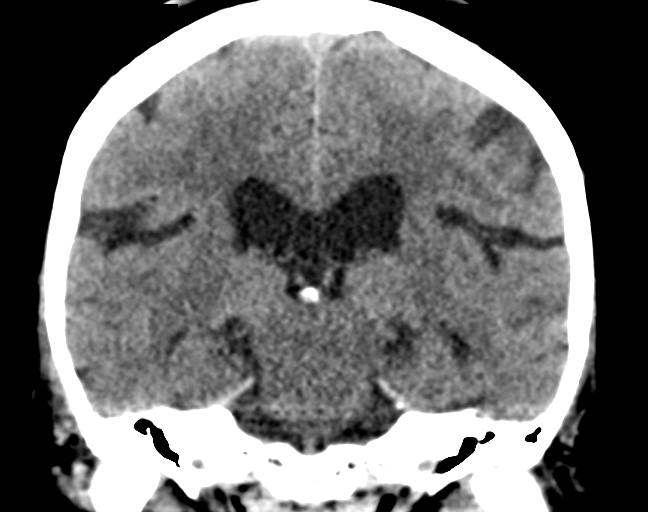
[im 32/58  brain]
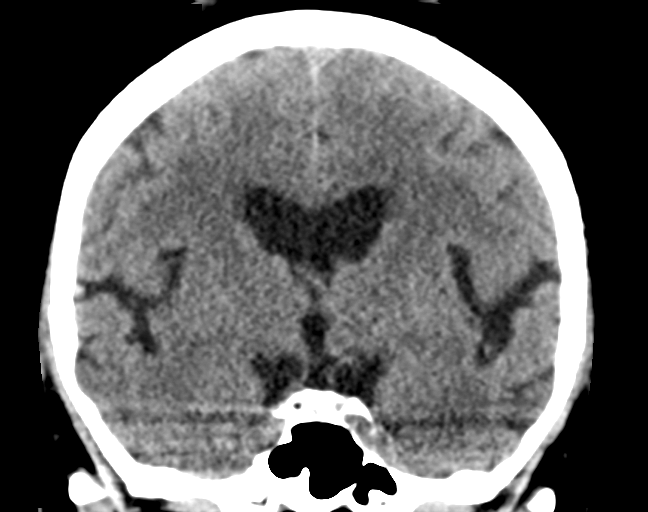

[Series 5: sagittal soft tissue · sagittal · 0.25mm/px · 3 of 51 slices shown]
[im 17/51  brain]
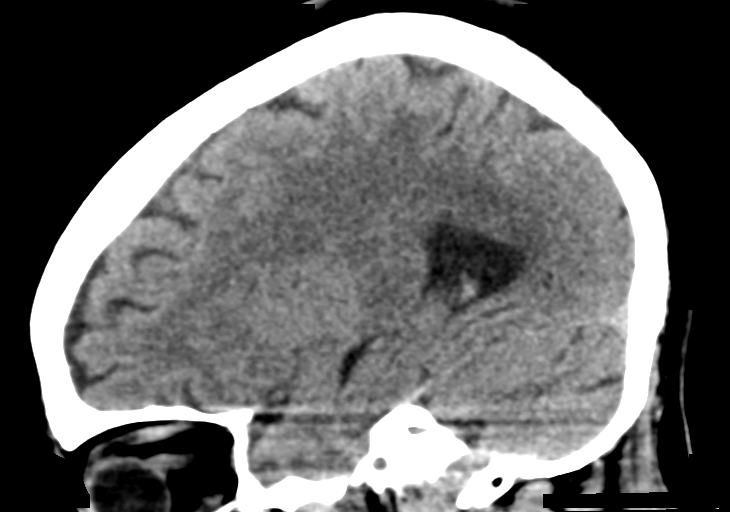
[im 26/51  brain]
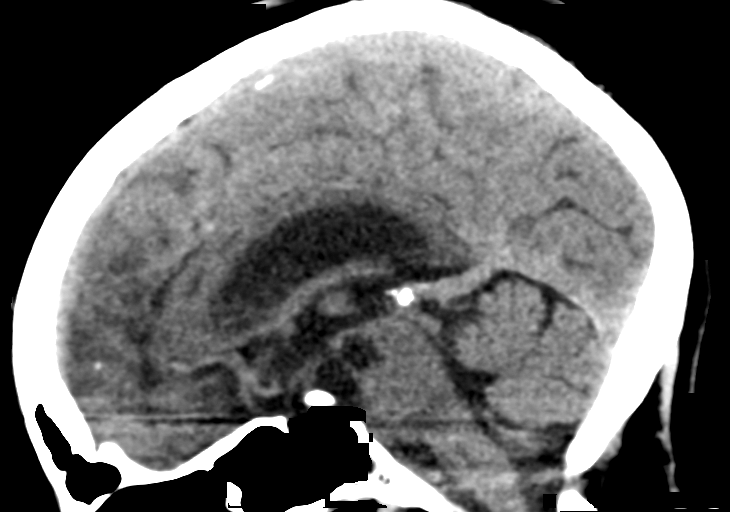
[im 34/51  brain]
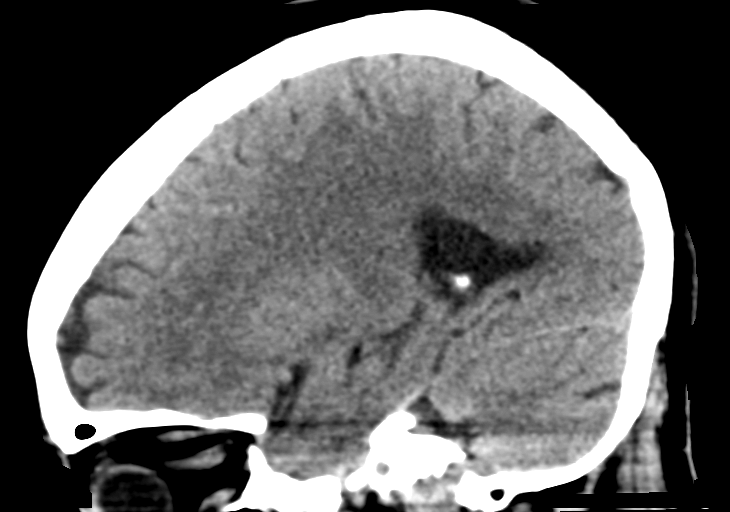

[15 of 43 positions shown; findings below may reference images not displayed]

FINDINGS: Brain: No intracranial hemorrhage or CT evidence of large acute
infarct.

No intracranial mass lesion noted on this unenhanced exam.

Mild atrophy without hydrocephalus.

Partially empty non expanded sella.

Vascular: Vascular calcifications.

Skull: Negative.

Sinuses/Orbits: Negative.

Other: Negative.
IMPRESSION: No acute intracranial abnormality.

## 2019-11-15 ENCOUNTER — Ambulatory Visit: Payer: Self-pay | Attending: Internal Medicine

## 2021-02-09 ENCOUNTER — Other Ambulatory Visit: Payer: Self-pay | Admitting: Family Medicine

## 2021-02-09 DIAGNOSIS — Z1231 Encounter for screening mammogram for malignant neoplasm of breast: Secondary | ICD-10-CM

## 2022-01-18 ENCOUNTER — Other Ambulatory Visit: Payer: Self-pay | Admitting: Physician Assistant

## 2022-01-18 DIAGNOSIS — Z1231 Encounter for screening mammogram for malignant neoplasm of breast: Secondary | ICD-10-CM
